# Patient Record
Sex: Female | Born: 2000
Health system: Southern US, Community
[De-identification: ages and names within clinical notes are randomized; demographics above are authoritative.]

## PROBLEM LIST (undated history)

## (undated) ENCOUNTER — Inpatient Hospital Stay (HOSPITAL_COMMUNITY): Payer: Self-pay

## (undated) DIAGNOSIS — Z6379 Other stressful life events affecting family and household: Secondary | ICD-10-CM

## (undated) DIAGNOSIS — A549 Gonococcal infection, unspecified: Secondary | ICD-10-CM

## (undated) DIAGNOSIS — E039 Hypothyroidism, unspecified: Secondary | ICD-10-CM

## (undated) DIAGNOSIS — J45909 Unspecified asthma, uncomplicated: Secondary | ICD-10-CM

## (undated) DIAGNOSIS — F99 Mental disorder, not otherwise specified: Secondary | ICD-10-CM

## (undated) DIAGNOSIS — L309 Dermatitis, unspecified: Secondary | ICD-10-CM

## (undated) DIAGNOSIS — E063 Autoimmune thyroiditis: Secondary | ICD-10-CM

## (undated) HISTORY — DX: Dermatitis, unspecified: L30.9

## (undated) HISTORY — DX: Other stressful life events affecting family and household: Z63.79

## (undated) HISTORY — DX: Mental disorder, not otherwise specified: F99

## (undated) HISTORY — DX: Gonococcal infection, unspecified: A54.9

## (undated) HISTORY — DX: Unspecified asthma, uncomplicated: J45.909

## (undated) HISTORY — PX: NO PAST SURGERIES: SHX2092

---
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
## 2019-01-20 ENCOUNTER — Telehealth: Payer: Self-pay | Admitting: *Deleted

## 2019-01-20 ENCOUNTER — Telehealth: Payer: Self-pay | Admitting: Women's Health

## 2019-01-20 ENCOUNTER — Other Ambulatory Visit: Payer: Self-pay | Admitting: Women's Health

## 2019-01-20 MED ORDER — NORETHINDRONE 0.35 MG PO TABS: 1.0000 | ORAL_TABLET | Freq: Every day | ORAL | 11 refills | Status: DC

## 2019-01-20 NOTE — Telephone Encounter (Signed)
Patient states she is wanting to switch to BCP as she is gaining weight since taking the Depo. Informed she would need to take at the same time daily and cannot miss doses.  She is currently still breastfeeding.  Denies history of migraines, blood clots, stroke.

## 2019-01-20 NOTE — Telephone Encounter (Signed)
Pt would like to see if can change from the depo shot to birth control pills. Please advise.

## 2019-03-10 ENCOUNTER — Telehealth: Payer: Self-pay | Admitting: *Deleted

## 2019-03-10 ENCOUNTER — Ambulatory Visit: Payer: Medicaid Other

## 2019-03-10 NOTE — Telephone Encounter (Signed)
Patient wants STD testing and pregnancy test.

## 2019-03-12 NOTE — Telephone Encounter (Signed)
Scheduled for 03/17/19 at 8:30am

## 2019-03-17 ENCOUNTER — Encounter: Payer: Self-pay | Admitting: Internal Medicine

## 2019-03-17 ENCOUNTER — Ambulatory Visit (INDEPENDENT_AMBULATORY_CARE_PROVIDER_SITE_OTHER): Payer: Medicaid Other | Admitting: Internal Medicine

## 2019-03-17 ENCOUNTER — Other Ambulatory Visit: Payer: Self-pay

## 2019-03-17 VITALS — BP 126/70 | HR 70 | Ht 64.0 in | Wt 219.0 lb

## 2019-03-17 DIAGNOSIS — Z3202 Encounter for pregnancy test, result negative: Secondary | ICD-10-CM | POA: Diagnosis not present

## 2019-03-17 DIAGNOSIS — Z113 Encounter for screening for infections with a predominantly sexual mode of transmission: Secondary | ICD-10-CM

## 2019-03-17 LAB — POCT URINE PREGNANCY: Preg Test, Ur: NEGATIVE
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
## 2019-03-17 NOTE — Patient Instructions (Signed)
Your pregnancy test was negative. We will call you if any of your screening comes back positive.   Take Care,  Dr. Juleen China

## 2019-03-20 ENCOUNTER — Other Ambulatory Visit: Payer: Self-pay | Admitting: Internal Medicine

## 2019-03-20 DIAGNOSIS — A549 Gonococcal infection, unspecified: Secondary | ICD-10-CM

## 2019-03-20 LAB — NUSWAB VAGINITIS PLUS (VG+)
Candida albicans, NAA: NEGATIVE
Candida glabrata, NAA: NEGATIVE
Chlamydia trachomatis, NAA: NEGATIVE
Neisseria gonorrhoeae, NAA: POSITIVE — AB
Trich vag by NAA: NEGATIVE

## 2019-03-20 MED ORDER — AZITHROMYCIN 500 MG PO TABS: 1000.0000 mg | ORAL_TABLET | Freq: Once | ORAL | 0 refills | Status: AC

## 2019-03-20 NOTE — Progress Notes (Signed)
Results showed patient Gonorrhea+. Have sent Azithromycin 1000 mg to pharmacy. Will route to Barbara Rieger, RN to inform patient and schedule RN visit to receive Rocephin.   Barbara Vazquez, D.O. [facility] Fellow, [facility] for [facility], [facility] 03/20/2019, 10:33 AM

## 2019-03-23 ENCOUNTER — Ambulatory Visit (INDEPENDENT_AMBULATORY_CARE_PROVIDER_SITE_OTHER): Payer: Medicaid Other | Admitting: *Deleted

## 2019-03-23 ENCOUNTER — Ambulatory Visit: Payer: Self-pay

## 2019-03-23 ENCOUNTER — Other Ambulatory Visit: Payer: Self-pay

## 2019-03-23 ENCOUNTER — Telehealth: Payer: Self-pay | Admitting: *Deleted

## 2019-03-23 ENCOUNTER — Encounter: Payer: Self-pay | Admitting: *Deleted

## 2019-03-23 DIAGNOSIS — A549 Gonococcal infection, unspecified: Secondary | ICD-10-CM | POA: Diagnosis not present

## 2019-03-23 MED ORDER — CEFTRIAXONE SODIUM 250 MG IJ SOLR
250.0000 mg | Freq: Once | INTRAMUSCULAR | Status: AC
  Administered 2019-03-23: 250 mg via INTRAMUSCULAR
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
## 2019-03-23 NOTE — Progress Notes (Signed)
Pt here for Rocephin 250 mg due to + gonorrhea. Pt received shot in right hip. Pt took oral med while in office. Pt to return in 4 weeks for POC. Pt voiced understanding. Steele

## 2019-03-31 ENCOUNTER — Ambulatory Visit (INDEPENDENT_AMBULATORY_CARE_PROVIDER_SITE_OTHER): Payer: Medicaid Other | Admitting: Pediatrics

## 2019-03-31 ENCOUNTER — Encounter: Payer: Self-pay | Admitting: Pediatrics

## 2019-03-31 ENCOUNTER — Other Ambulatory Visit: Payer: Self-pay

## 2019-03-31 VITALS — BP 132/86 | Ht 64.5 in | Wt 220.2 lb

## 2019-03-31 DIAGNOSIS — Z68.41 Body mass index (BMI) pediatric, greater than or equal to 95th percentile for age: Secondary | ICD-10-CM

## 2019-03-31 DIAGNOSIS — Z0001 Encounter for general adult medical examination with abnormal findings: Secondary | ICD-10-CM

## 2019-03-31 DIAGNOSIS — E669 Obesity, unspecified: Secondary | ICD-10-CM

## 2019-03-31 NOTE — Progress Notes (Signed)
Adolescent Well Care Visit Barbara Vazquez is a 18 y.o. female who is here for well care.    PCP:  Rosiland Oz, MD   History was provided by the patient.  Current Issues: Current concerns include needs form for a daycare completed, mother is seeking employment there.   She is a new mother as well, she has an infant girl and is breast feeding her daughter. The child's father is currently "not helping her"   Nutrition: Nutrition/Eating Behaviors: eats variety  Supplements/ Vitamins: yes   Sleep:  Sleep:  Night time awakenings with her daughter   Social Screening: Lives with:  Alone, has support from a friend over  Parental relations:  good Activities, Work, and Regulatory affairs officer?: yes Concerns regarding behavior with peers?  no Stressors of note: no  Menstruation:   No LMP recorded (lmp unknown). (Menstrual status: Lactating). Menstrual History: n/a   Confidential Social History: Tobacco?  no Secondhand smoke exposure?  no Drugs/ETOH?  no  Sexually Active?  yes   Pregnancy Prevention: condoms  Safe at home, in school & in relationships?  Yes Safe to self?  Yes   Screenings: Patient has a dental home: yes   PHQ-9 completed and results indicated 10  Physical Exam:  Vitals:   03/31/19 1450  BP: 132/86  Weight: 220 lb 3.2 oz (99.9 kg)  Height: 5' 4.5" (1.638 m)   BP 132/86   Ht 5' 4.5" (1.638 m)   Wt 220 lb 3.2 oz (99.9 kg)   LMP  (LMP Unknown)   BMI 37.21 kg/m  Body mass index: body mass index is 37.21 kg/m. Blood pressure percentiles are not available for patients who are 18 years or older.   Hearing Screening   125Hz  250Hz  500Hz  1000Hz  2000Hz  3000Hz  4000Hz  6000Hz  8000Hz   Right ear:           Left ear:             Visual Acuity Screening   Right eye Left eye Both eyes  Without correction: 20/20 20/25   With correction:       General Appearance:   alert, oriented, no acute distress  HENT: Normocephalic, no obvious abnormality, conjunctiva clear   Mouth:   Normal appearing teeth, no obvious discoloration, dental caries, or dental caps  Neck:   Supple; thyroid: no enlargement, symmetric, no tenderness/mass/nodules  Chest Normal   Lungs:   Clear to auscultation bilaterally, normal work of breathing  Heart:   Regular rate and rhythm, S1 and S2 normal, no murmurs;   Abdomen:   Soft, non-tender, no mass, or organomegaly  GU genitalia not examined  Musculoskeletal:   Tone and strength strong and symmetrical, all extremities               Lymphatic:   No cervical adenopathy  Skin/Hair/Nails:   Skin warm, dry and intact, no rashes, no bruises or petechiae  Neurologic:   Strength, gait, and coordination normal and age-appropriate     Assessment and Plan:   .1. Encounter for general adult medical examination with abnormal findings - GC/Chlamydia Probe Amp([facility])  2. Obesity peds (BMI >=95 percentile)   BMI is not appropriate for age  Hearing screening result:unable to screen, screener not working Vision screening result: normal  Counseling provided for all of the vaccine components  Orders Placed This Encounter  Procedures  . GC/Chlamydia Probe Amp([facility])    MD completed daycare form and gave to patient today   MD gave [facility] vaccination record and  advised her to receive her Hep B, Hep A, Flu, Varicella and Men B at the local [facility]   Return in 1 year (on 03/30/2020).Fransisca Connors, MD

## 2019-04-02 LAB — GC/CHLAMYDIA PROBE AMP
Chlamydia trachomatis, NAA: NEGATIVE
Neisseria Gonorrhoeae by PCR: NEGATIVE

## 2019-04-20 ENCOUNTER — Ambulatory Visit: Payer: Medicaid Other | Admitting: Women's Health

## 2019-05-11 ENCOUNTER — Other Ambulatory Visit: Payer: Self-pay

## 2019-05-11 ENCOUNTER — Ambulatory Visit: Payer: Medicaid Other | Admitting: Women's Health

## 2019-05-26 ENCOUNTER — Other Ambulatory Visit: Payer: Self-pay

## 2019-05-26 DIAGNOSIS — Z20828 Contact with and (suspected) exposure to other viral communicable diseases: Secondary | ICD-10-CM | POA: Diagnosis not present

## 2019-05-26 DIAGNOSIS — Z20822 Contact with and (suspected) exposure to covid-19: Secondary | ICD-10-CM

## 2019-05-27 LAB — NOVEL CORONAVIRUS, NAA: SARS-CoV-2, NAA: NOT DETECTED

## 2019-06-19 IMAGING — US US ABDOMEN LIMITED
1 series · 14 of 25 positions shown · non-contrast
Comparison: None.

CLINICAL DATA: Right upper quadrant pain

EXAM:
ULTRASOUND ABDOMEN LIMITED RIGHT UPPER QUADRANT

[Series 1: us abdomen limited · 0.19mm/px · 14 of 56 slices shown]
[im 1/56]
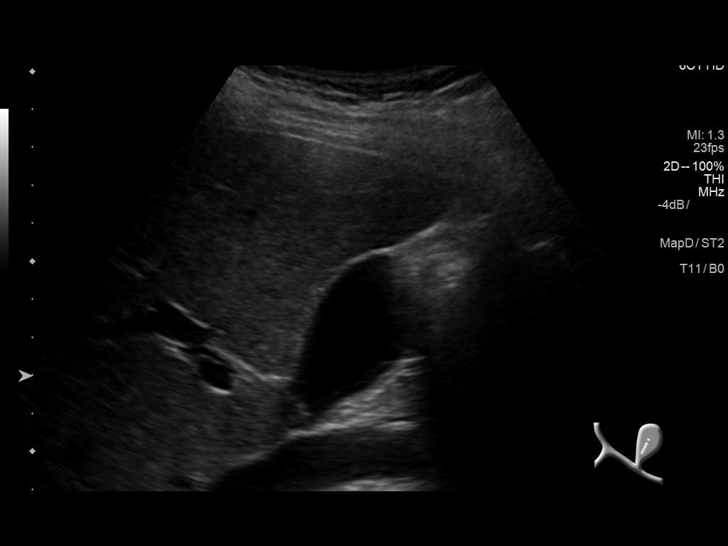
[im 5/56]
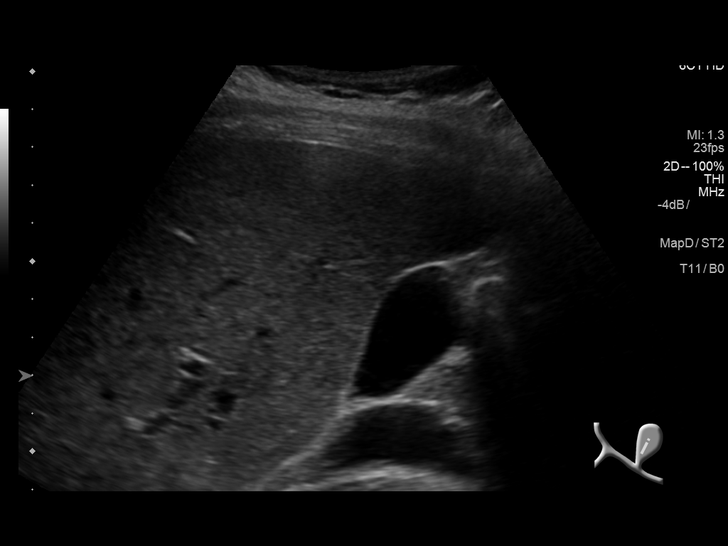
[im 10/56]
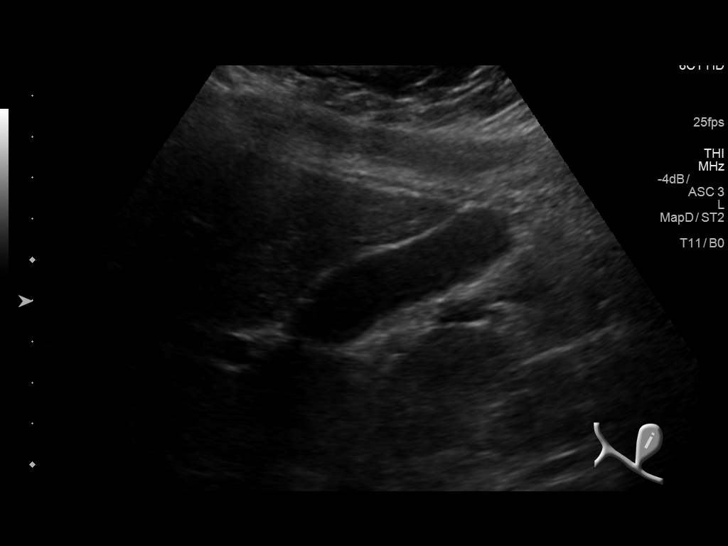
[im 14/56]
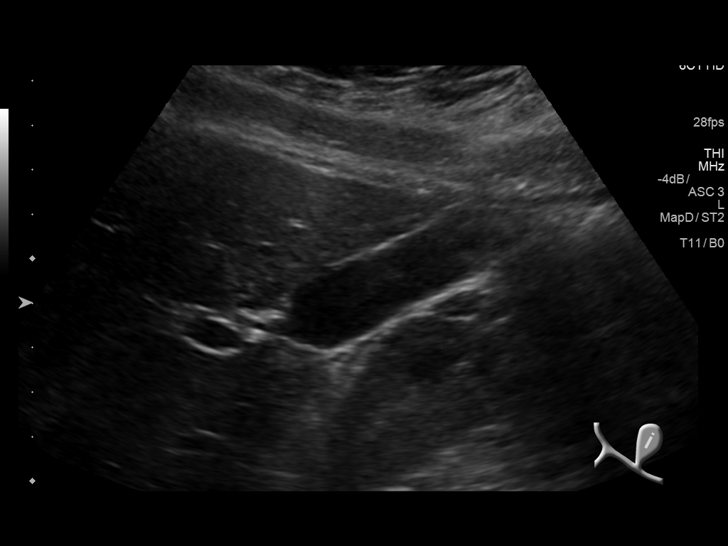
[im 19/56]
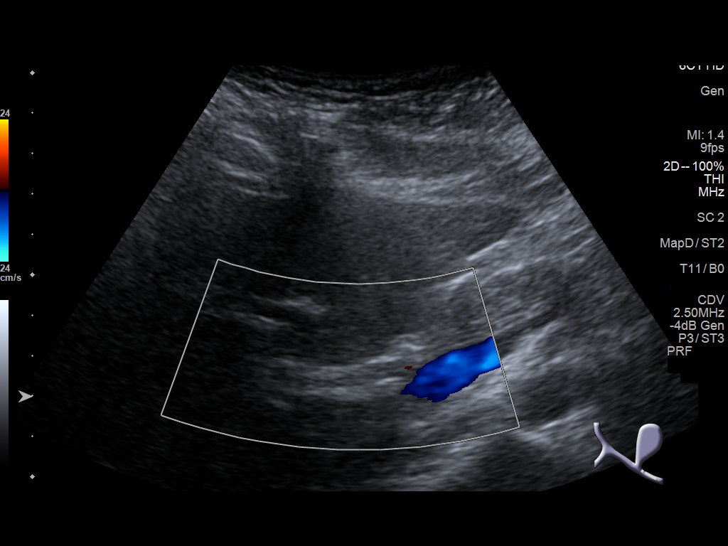
[im 21/56]
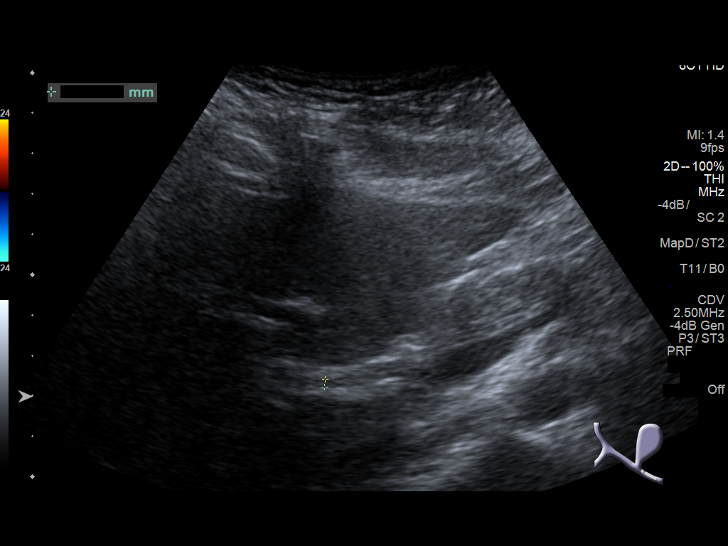
[im 26/56]
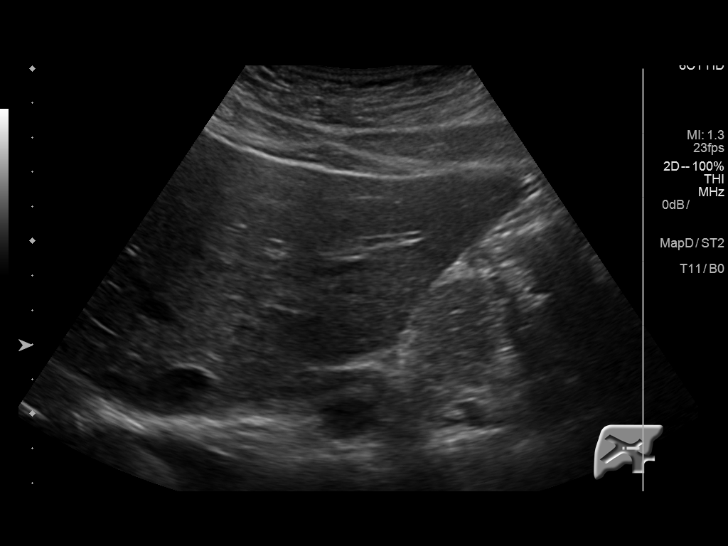
[im 30/56]
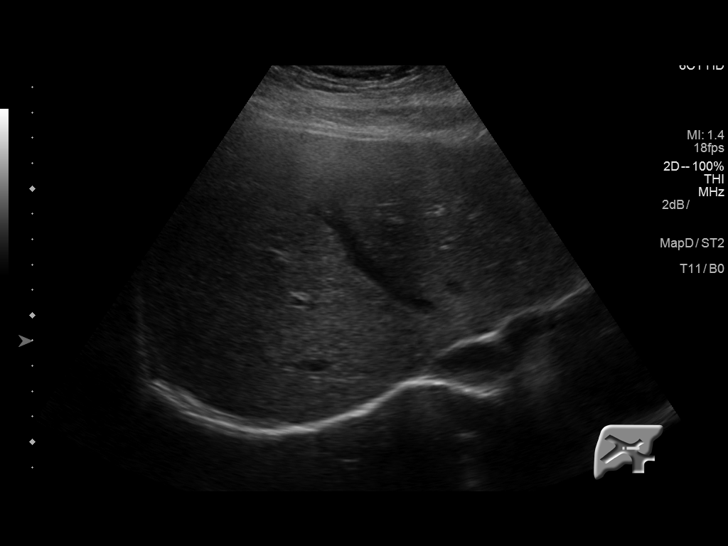
[im 35/56]
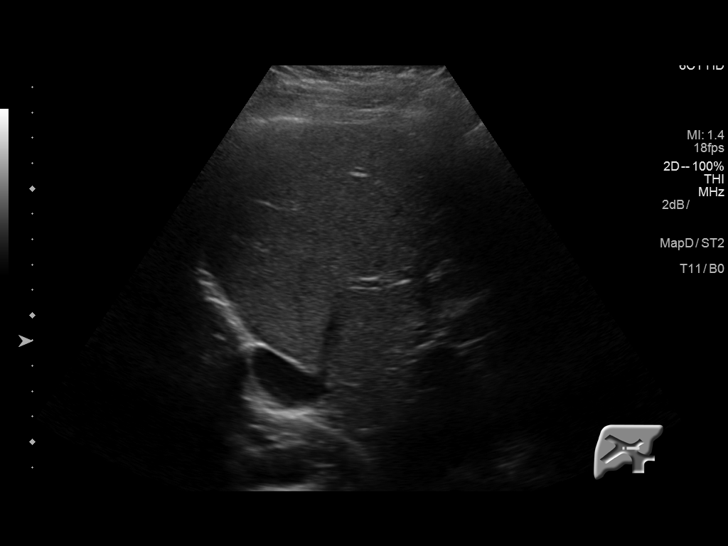
[im 37/56]
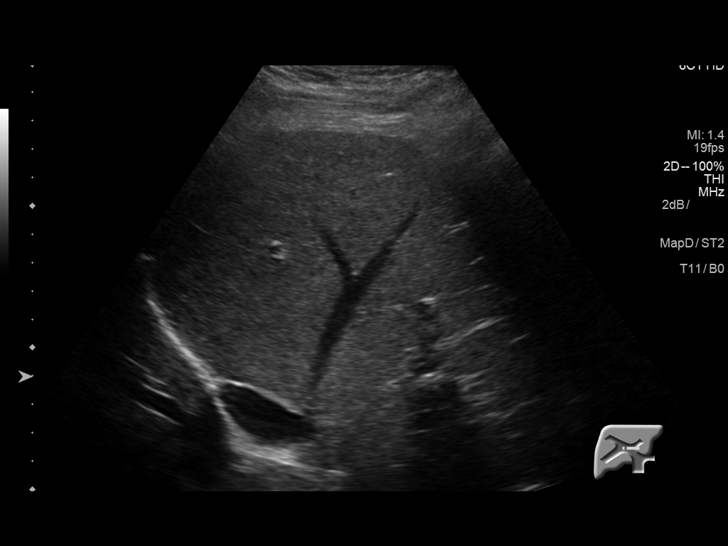
[im 42/56]
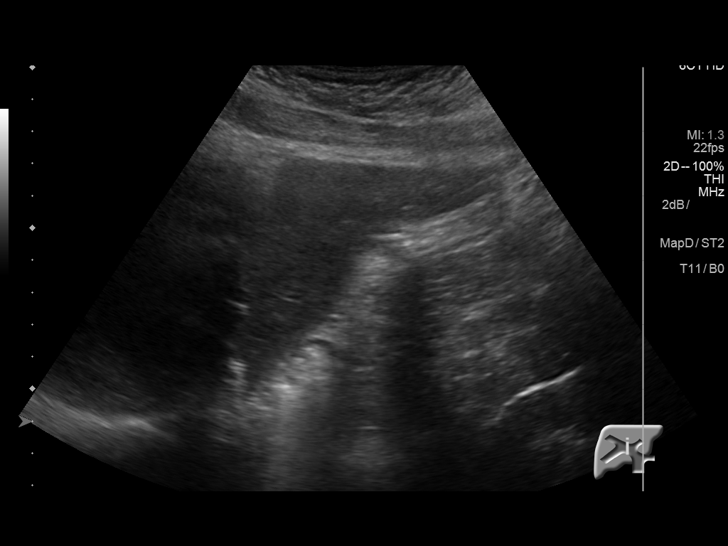
[im 46/56]
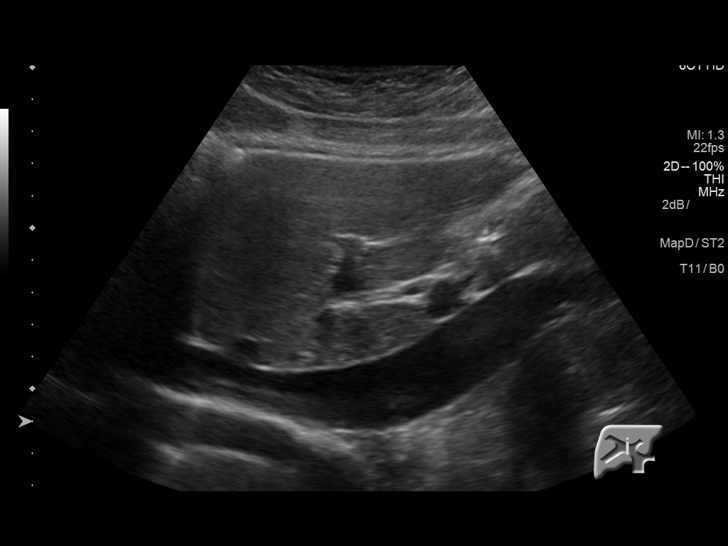
[im 51/56]
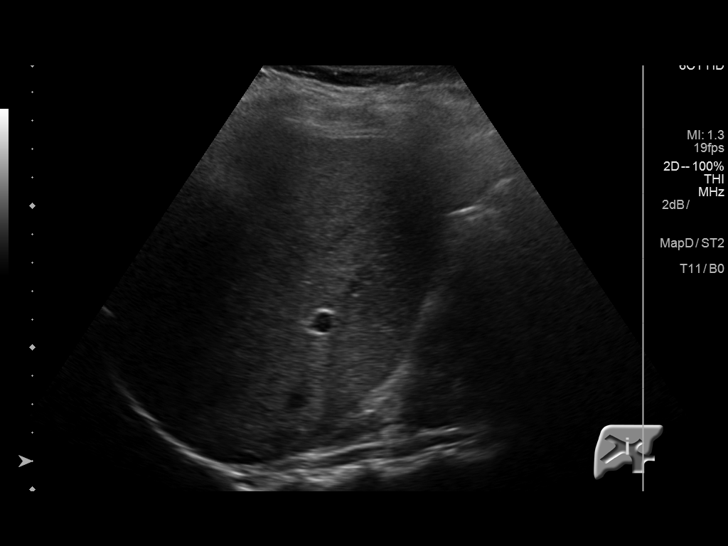
[im 56/56]
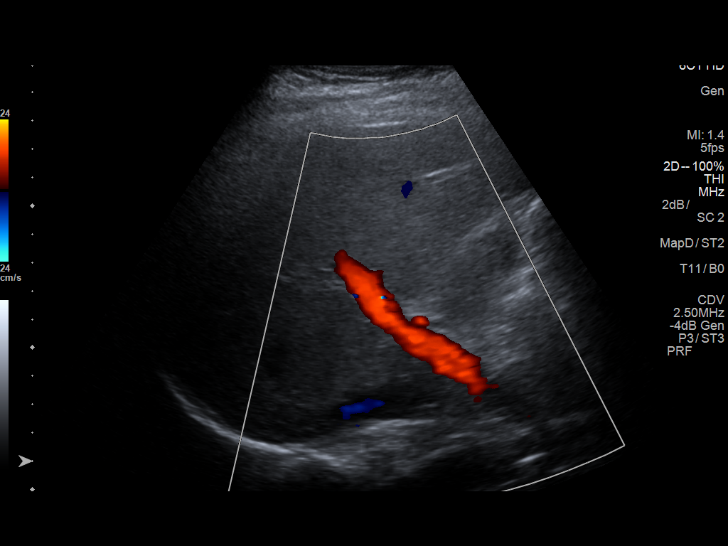

[14 of 25 positions shown; findings below may reference images not displayed]

FINDINGS: Gallbladder:

No gallstones or wall thickening visualized. No sonographic Murphy
sign noted by sonographer.

Common bile duct:

Diameter: Normal caliber, 4 mm

Liver:

No focal lesion identified. Within normal limits in parenchymal
echogenicity. Portal vein is patent on color Doppler imaging with
normal direction of blood flow towards the liver.
IMPRESSION: Normal right upper quadrant ultrasound.

## 2019-09-16 ENCOUNTER — Other Ambulatory Visit: Payer: Self-pay

## 2019-09-16 ENCOUNTER — Ambulatory Visit (INDEPENDENT_AMBULATORY_CARE_PROVIDER_SITE_OTHER): Payer: Medicaid Other | Admitting: Pediatrics

## 2019-09-16 VITALS — Wt 226.2 lb

## 2019-09-16 DIAGNOSIS — L309 Dermatitis, unspecified: Secondary | ICD-10-CM | POA: Diagnosis not present

## 2019-09-16 MED ORDER — HYDROCORTISONE 2.5 % EX CREA: TOPICAL_CREAM | Freq: Two times a day (BID) | CUTANEOUS | 2 refills | Status: DC

## 2019-09-16 NOTE — Progress Notes (Signed)
This is Lorelle Formosa, a 19 year old patient who was brought in by herself with the chief complaint of eczema.  This child was well until 7 days ago when they had symptoms of itching  followed by redness of the area..    Onset & character of symptoms    Location upper extremities.   Radiation none   Quality itches   Quantity 6-8/10   Duration several hours   Frequency nightly after showering    Aggravating Factors heat/shower   Reliving Factors lotion   Associated signs and symptoms none  Alterations in behavior, activity, eating, sleeping, elimination, affects sleep   Significant Past Medical History related to Chief Complaint:    Illnesses: Recent, when, where, severity, treatment, follow-up N/A   Current medications include OTC lotions   Exposure(day care, school, travel) none     Home treatments/medications tried: lotion    Review of systems is unremarkable except for red, bumpy rash on upper extrimities   On presentation to the clinic today, child's physical exam includes:   HEENT - unremarkable   CV- RRR with out murmur   Resp- CTA   GI- Abd soft with good bowel sounds   GU- not examined   MS- AROM   Neuro - no deficients    Labs- n/a   This is a 19year old female with eczema.   Plan - use hydrocortisone with lotion up to 2 times daily for not more then 7 days at a time Prescription-2.5% hydrocortisone Follow-up - PRN  Please call or return to clinic if symptoms fail to improve or worsen, especially symptoms of itching.

## 2019-09-16 NOTE — Patient Instructions (Signed)

## 2019-10-21 ENCOUNTER — Ambulatory Visit (INDEPENDENT_AMBULATORY_CARE_PROVIDER_SITE_OTHER): Payer: Medicaid Other | Admitting: Pediatrics

## 2019-10-21 ENCOUNTER — Other Ambulatory Visit: Payer: Self-pay

## 2019-10-21 DIAGNOSIS — Z111 Encounter for screening for respiratory tuberculosis: Secondary | ICD-10-CM

## 2019-10-21 NOTE — Progress Notes (Signed)
This patient is here to have a TB skin test placed.  NP placed TB skin test in the right forearm.  This patient will return in 48 hours to have the TB skin test read.

## 2019-10-23 ENCOUNTER — Ambulatory Visit (INDEPENDENT_AMBULATORY_CARE_PROVIDER_SITE_OTHER): Payer: Self-pay | Admitting: Pediatrics

## 2019-10-23 ENCOUNTER — Other Ambulatory Visit: Payer: Self-pay

## 2019-10-23 DIAGNOSIS — Z111 Encounter for screening for respiratory tuberculosis: Secondary | ICD-10-CM

## 2019-10-26 ENCOUNTER — Ambulatory Visit: Payer: Self-pay

## 2019-11-16 IMAGING — US US OB < 14 WEEKS - US OB TV
1 series · 14 of 28 positions shown · non-contrast
Comparison: None.

CLINICAL DATA: Dating of pregnancy.

EXAM:
OBSTETRIC <14 WK US AND TRANSVAGINAL OB US
TECHNIQUE: Both transabdominal and transvaginal ultrasound examinations were
performed for complete evaluation of the gestation as well as the
maternal uterus, adnexal regions, and pelvic cul-de-sac.
Transvaginal technique was performed to assess early pregnancy.

[Series 1: us ob < 14 weeks - us ob tv · 0.19mm/px · 14 of 64 slices shown]
[im 3/64]
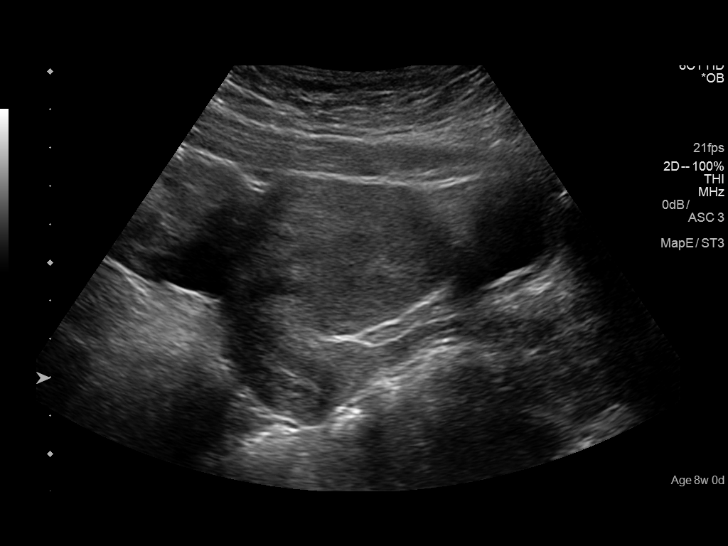
[im 8/64]
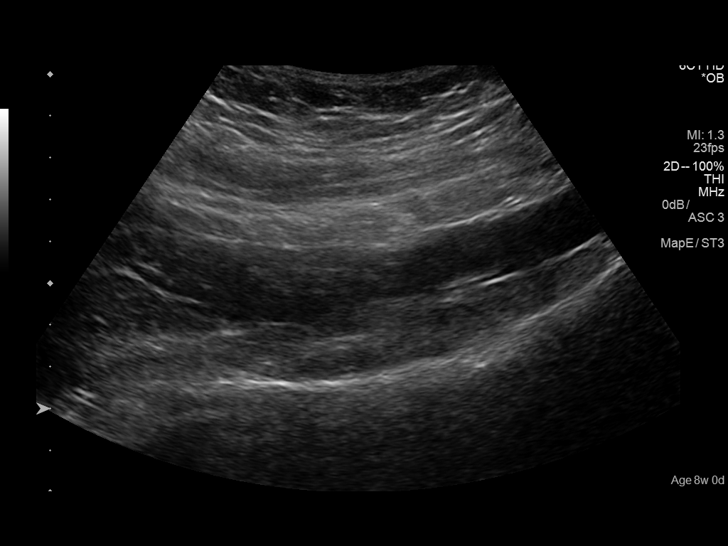
[im 12/64]
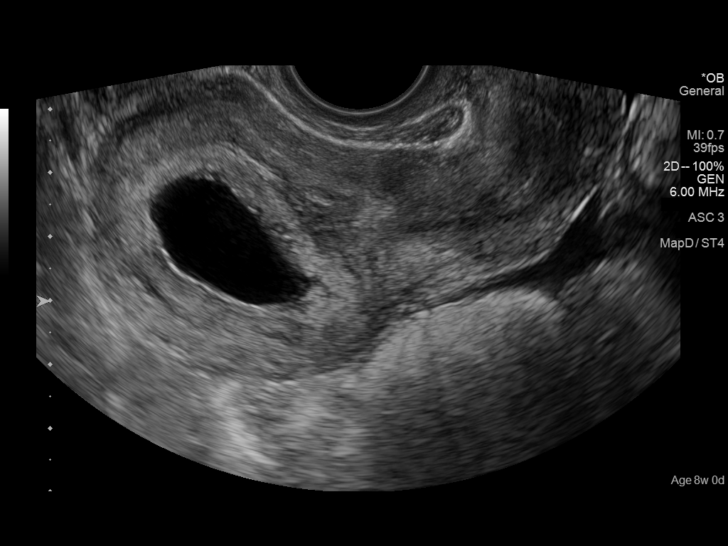
[im 17/64]
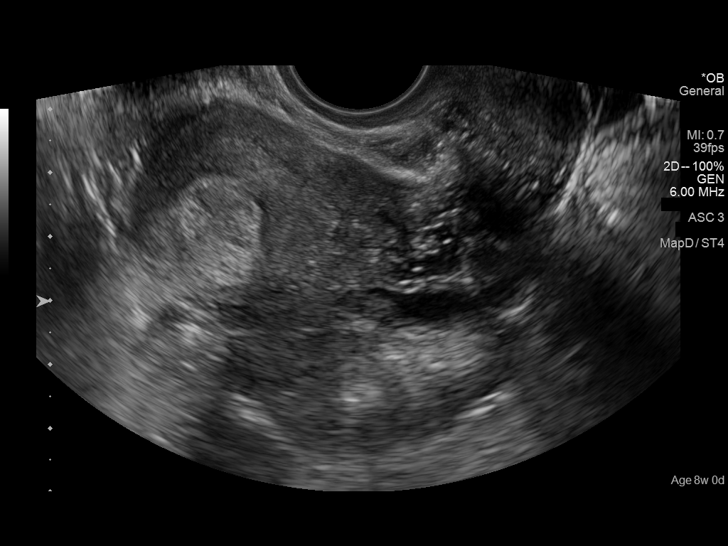
[im 22/64]
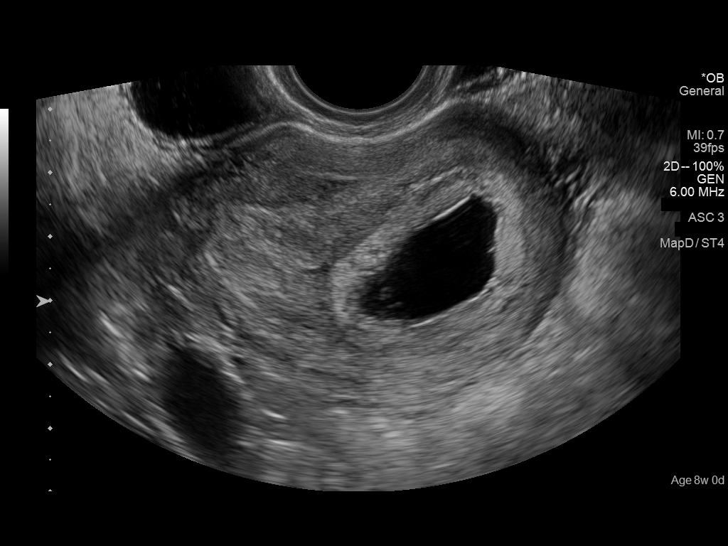
[im 26/64]
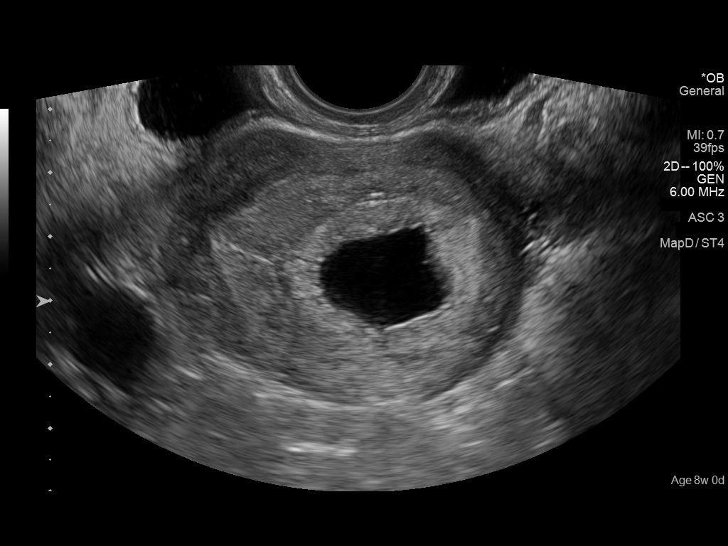
[im 31/64]
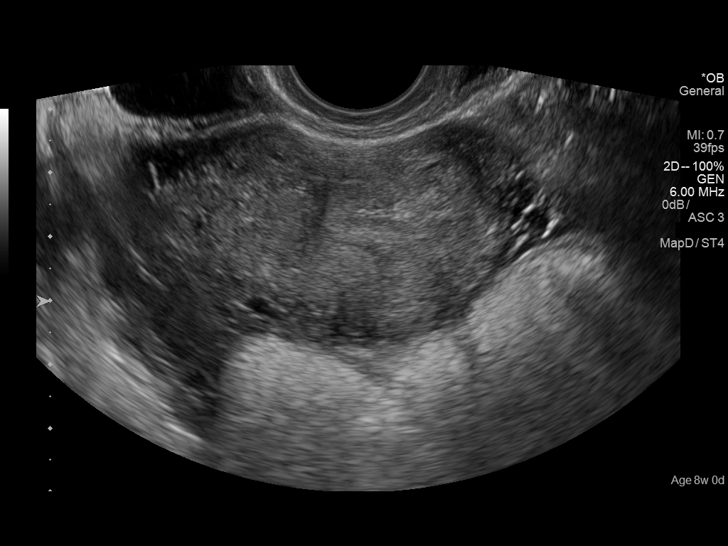
[im 36/64]
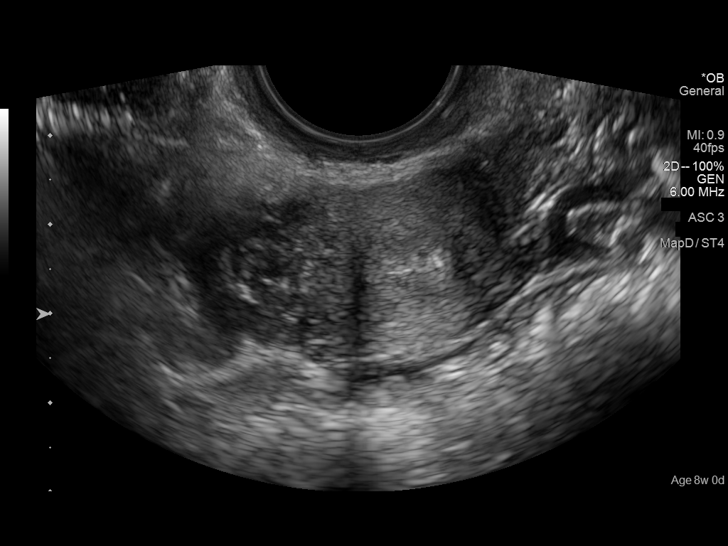
[im 40/64]
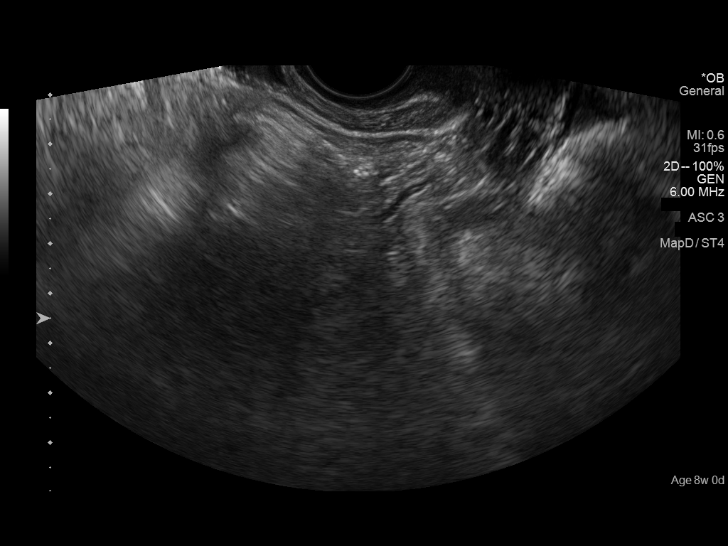
[im 45/64]
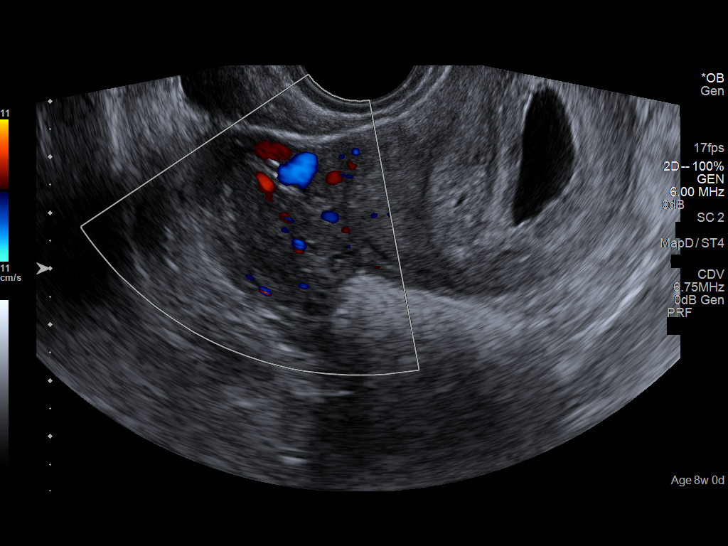
[im 50/64]
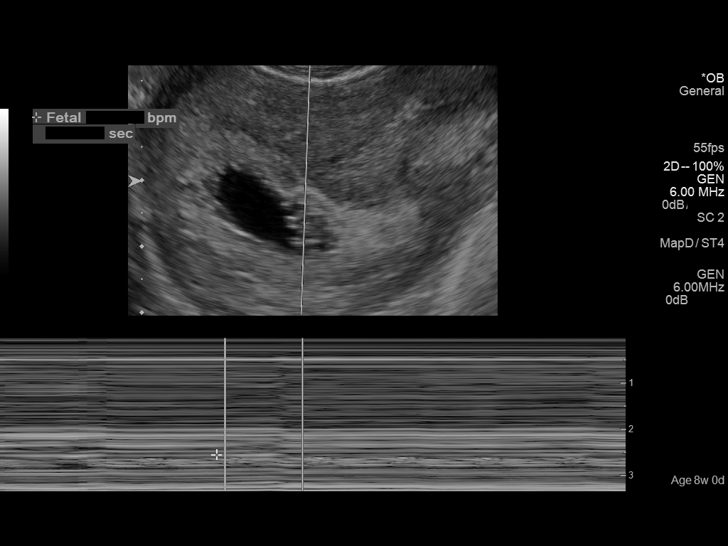
[im 54/64]
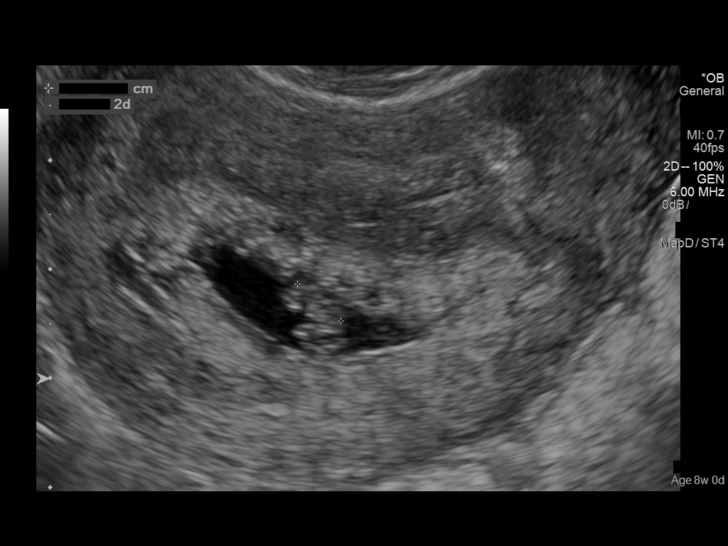
[im 59/64]
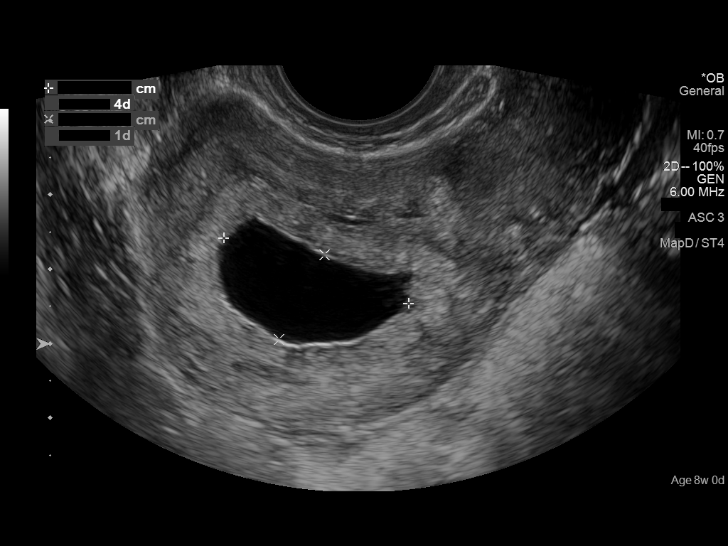
[im 64/64]
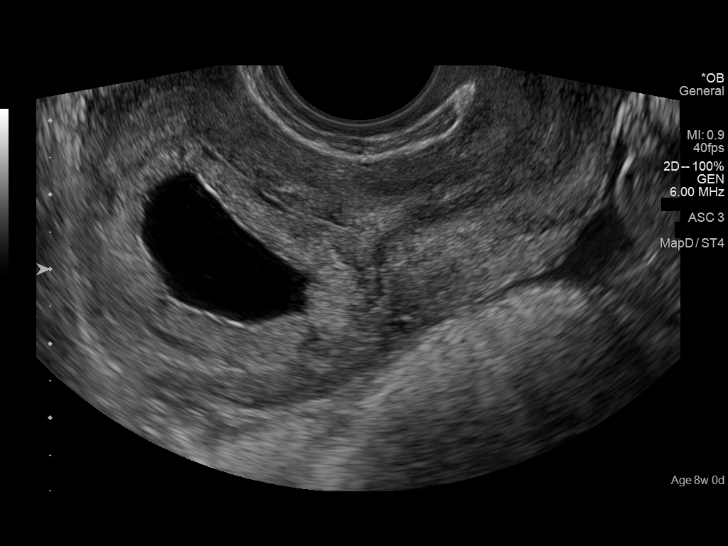

[14 of 28 positions shown; findings below may reference images not displayed]

FINDINGS: Intrauterine gestational sac: Single

Yolk sac:  Yes

Embryo:  Yes

Cardiac Activity: Yes

Heart Rate: 132 bpm

CRL:  5.4 mm   6 w   2 d                  US EDC: 09/27/2018

Subchorionic hemorrhage:  None visualized.

Maternal uterus/adnexae:

Right ovary: Normal

Left ovary: Not visualized due to overlying bowel gas.

Other :None

Free fluid:  None
IMPRESSION: 1. Single living intrauterine gestation with an estimated
gestational age of 6 weeks and 2 days.

## 2019-12-11 ENCOUNTER — Ambulatory Visit: Payer: Medicaid Other

## 2019-12-31 DIAGNOSIS — Z419 Encounter for procedure for purposes other than remedying health state, unspecified: Secondary | ICD-10-CM | POA: Diagnosis not present

## 2020-01-31 DIAGNOSIS — Z419 Encounter for procedure for purposes other than remedying health state, unspecified: Secondary | ICD-10-CM | POA: Diagnosis not present

## 2020-03-02 DIAGNOSIS — Z419 Encounter for procedure for purposes other than remedying health state, unspecified: Secondary | ICD-10-CM | POA: Diagnosis not present

## 2020-03-04 ENCOUNTER — Other Ambulatory Visit: Payer: Medicaid Other

## 2020-03-04 ENCOUNTER — Other Ambulatory Visit: Payer: Self-pay

## 2020-03-04 DIAGNOSIS — Z20822 Contact with and (suspected) exposure to covid-19: Secondary | ICD-10-CM | POA: Diagnosis not present

## 2020-03-06 LAB — NOVEL CORONAVIRUS, NAA: SARS-CoV-2, NAA: DETECTED — AB

## 2020-03-07 ENCOUNTER — Encounter: Payer: Self-pay | Admitting: Oncology

## 2020-03-07 ENCOUNTER — Telehealth (HOSPITAL_COMMUNITY): Payer: Self-pay | Admitting: Oncology

## 2020-03-07 NOTE — Telephone Encounter (Signed)
Called to Discuss with patient about Covid symptoms and the use of regeneron, a monoclonal antibody infusion for those with mild to moderate Covid symptoms and at a high risk of hospitalization.     Pt is qualified for this infusion at the [facility]  infusion center due to co-morbid conditions and/or a member of an at-risk group.     Patient is feeling much better and does not feel like she will need mab. Will call back if anything changes.   Mignon Pine, AGNP-C [phone] (Infusion Center Hotline)

## 2020-03-17 ENCOUNTER — Other Ambulatory Visit: Payer: Self-pay

## 2020-04-01 DIAGNOSIS — Z419 Encounter for procedure for purposes other than remedying health state, unspecified: Secondary | ICD-10-CM | POA: Diagnosis not present

## 2020-05-02 DIAGNOSIS — Z419 Encounter for procedure for purposes other than remedying health state, unspecified: Secondary | ICD-10-CM | POA: Diagnosis not present

## 2020-05-03 ENCOUNTER — Other Ambulatory Visit: Payer: Self-pay

## 2020-05-03 ENCOUNTER — Emergency Department (HOSPITAL_COMMUNITY): Payer: Medicaid Other

## 2020-05-03 ENCOUNTER — Ambulatory Visit
Admission: EM | Admit: 2020-05-03 | Discharge: 2020-05-03 | Disposition: A | Discharge status: Home / Self Care | Payer: Medicaid Other | Attending: Emergency Medicine | Admitting: Emergency Medicine

## 2020-05-03 ENCOUNTER — Encounter (HOSPITAL_COMMUNITY): Payer: Self-pay

## 2020-05-03 ENCOUNTER — Emergency Department (HOSPITAL_COMMUNITY)
Admission: EM | Admit: 2020-05-03 | Discharge: 2020-05-03 | Disposition: A | Discharge status: Home / Self Care | Payer: Medicaid Other | Attending: Emergency Medicine | Admitting: Emergency Medicine

## 2020-05-03 DIAGNOSIS — R102 Pelvic and perineal pain: Secondary | ICD-10-CM

## 2020-05-03 DIAGNOSIS — M545 Low back pain, unspecified: Secondary | ICD-10-CM | POA: Diagnosis not present

## 2020-05-03 DIAGNOSIS — R300 Dysuria: Secondary | ICD-10-CM | POA: Diagnosis present

## 2020-05-03 DIAGNOSIS — R109 Unspecified abdominal pain: Secondary | ICD-10-CM | POA: Insufficient documentation

## 2020-05-03 DIAGNOSIS — Z87891 Personal history of nicotine dependence: Secondary | ICD-10-CM | POA: Diagnosis not present

## 2020-05-03 DIAGNOSIS — N3001 Acute cystitis with hematuria: Secondary | ICD-10-CM | POA: Insufficient documentation

## 2020-05-03 DIAGNOSIS — N390 Urinary tract infection, site not specified: Secondary | ICD-10-CM

## 2020-05-03 DIAGNOSIS — J45909 Unspecified asthma, uncomplicated: Secondary | ICD-10-CM | POA: Diagnosis not present

## 2020-05-03 LAB — POCT URINALYSIS DIP (MANUAL ENTRY)
Bilirubin, UA: NEGATIVE
Glucose, UA: NEGATIVE mg/dL
Ketones, POC UA: NEGATIVE mg/dL
Nitrite, UA: POSITIVE — AB
Protein Ur, POC: 100 mg/dL — AB
Spec Grav, UA: 1.025 (ref 1.010–1.025)
Urobilinogen, UA: 0.2 E.U./dL
pH, UA: 7 (ref 5.0–8.0)

## 2020-05-03 LAB — CBC
HCT: 43.1 % (ref 36.0–46.0)
Hemoglobin: 13.5 g/dL (ref 12.0–15.0)
MCH: 29.1 pg (ref 26.0–34.0)
MCHC: 31.3 g/dL (ref 30.0–36.0)
MCV: 92.9 fL (ref 80.0–100.0)
Platelets: 460 10*3/uL — ABNORMAL HIGH (ref 150–400)
RBC: 4.64 MIL/uL (ref 3.87–5.11)
RDW: 13.9 % (ref 11.5–15.5)
WBC: 11.2 10*3/uL — ABNORMAL HIGH (ref 4.0–10.5)
nRBC: 0 % (ref 0.0–0.2)

## 2020-05-03 LAB — COMPREHENSIVE METABOLIC PANEL
ALT: 14 U/L (ref 0–44)
AST: 12 U/L — ABNORMAL LOW (ref 15–41)
Albumin: 4.1 g/dL (ref 3.5–5.0)
Alkaline Phosphatase: 63 U/L (ref 38–126)
Anion gap: 7 (ref 5–15)
BUN: 11 mg/dL (ref 6–20)
CO2: 27 mmol/L (ref 22–32)
Calcium: 8.9 mg/dL (ref 8.9–10.3)
Chloride: 101 mmol/L (ref 98–111)
Creatinine, Ser: 0.62 mg/dL (ref 0.44–1.00)
GFR, Estimated: >60 mL/min (ref >60)
Glucose, Bld: 89 mg/dL (ref 70–99)
Potassium: 3.9 mmol/L (ref 3.5–5.1)
Sodium: 135 mmol/L (ref 135–145)
Total Bilirubin: 0.3 mg/dL (ref 0.3–1.2)
Total Protein: 7.7 g/dL (ref 6.5–8.1)

## 2020-05-03 LAB — LIPASE, BLOOD: Lipase: 23 U/L (ref 11–51)

## 2020-05-03 LAB — WET PREP, GENITAL
Clue Cells Wet Prep HPF POC: NONE SEEN
Sperm: NONE SEEN
Trich, Wet Prep: NONE SEEN
Yeast Wet Prep HPF POC: NONE SEEN

## 2020-05-03 LAB — HIV ANTIBODY (ROUTINE TESTING W REFLEX): HIV Screen 4th Generation wRfx: NONREACTIVE

## 2020-05-03 LAB — PREGNANCY, URINE: Preg Test, Ur: NEGATIVE

## 2020-05-03 MED ORDER — AZITHROMYCIN 250 MG PO TABS
2000.0000 mg | ORAL_TABLET | Freq: Once | ORAL | Status: AC
  Administered 2020-05-03: 2000 mg via ORAL
  Filled 2020-05-03: qty 8

## 2020-05-03 MED ORDER — ACETAMINOPHEN 325 MG PO TABS
650.0000 mg | ORAL_TABLET | Freq: Once | ORAL | Status: AC
  Administered 2020-05-03: 650 mg via ORAL
  Filled 2020-05-03: qty 2

## 2020-05-03 MED ORDER — GENTAMICIN SULFATE 40 MG/ML IJ SOLN
240.0000 mg | Freq: Once | INTRAMUSCULAR | Status: AC
  Administered 2020-05-03: 240 mg via INTRAMUSCULAR
  Filled 2020-05-03: qty 6

## 2020-05-03 MED ORDER — NITROFURANTOIN MONOHYD MACRO 100 MG PO CAPS: 100.0000 mg | ORAL_CAPSULE | Freq: Two times a day (BID) | ORAL | 0 refills | Status: DC

## 2020-05-03 MED ORDER — DOXYCYCLINE HYCLATE 100 MG PO CAPS: 100.0000 mg | ORAL_CAPSULE | Freq: Two times a day (BID) | ORAL | 0 refills | Status: DC

## 2020-05-03 MED ORDER — ONDANSETRON HCL 4 MG PO TABS: 4.0000 mg | ORAL_TABLET | Freq: Three times a day (TID) | ORAL | 0 refills | Status: DC | PRN

## 2020-05-03 MED ORDER — ONDANSETRON 4 MG PO TBDP
4.0000 mg | ORAL_TABLET | Freq: Once | ORAL | Status: AC
  Administered 2020-05-03: 4 mg via ORAL
  Filled 2020-05-03: qty 1

## 2020-05-03 MED ORDER — OXYCODONE-ACETAMINOPHEN 5-325 MG PO TABS
1.0000 | ORAL_TABLET | Freq: Once | ORAL | Status: AC
  Administered 2020-05-03: 1 via ORAL
  Filled 2020-05-03: qty 1

## 2020-05-03 NOTE — ED Provider Notes (Signed)
[facility] EMERGENCY DEPARTMENT Provider Note   CSN: [id number] Arrival date & time: 05/03/20  1304     History Chief Complaint  Patient presents with  . Abdominal Pain    Barbara Vazquez is a 19 y.o. female.  HPI   Patient with no significant medical history presents to the emergency department with chief complaint of dysuria and vaginal pain.  Patient states this started on Saturday where she noticed pain with urination, denies hematuria, frequency, or hesitancy.  She then noticed that she had some vaginal discharge and pelvic cramping.  She states pelvic cramping is mostly felt in her lower back and states it feels like contractions.  She has been taking over-the-counter pain medication without any relief.  She states this feels different from her normal UTIs as she doesn't  have vaginal pain.  She denies recent sexual intercourse and her last menstrual cycle ended on the 26th without any abnormalities.  She was seen at urgent care today where they performed a UA showing possible UTI and started on antibiotics.  Patient has no history of kidney stones, abdominal surgeries, no abnormal Pap smears.  Patient denies headache, fever, chills, shortness of breath, chest pain, abdominal pain, vomiting, diarrhea, pedal edema.  Past Medical History:  Diagnosis Date  . Asthma   . Gonorrhea   . Mental disorder    anxiety  . Teenage parent    March 2020    Patient Active Problem List   Diagnosis Date Noted  . Gonorrhea 03/20/2019  . Postpartum care following vaginal delivery 10/27/2018  . Contraception management 10/27/2018  . 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 Asthma, chronic 01/06/2015  . Allergic conjunctivitis 10/13/2014  . 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 Seasonal allergies 10/28/2012    Past Surgical History:  Procedure Laterality Date  . NO PAST SURGERIES       OB History    Gravida  1   Para  1   Term  1   Preterm  0   AB  0   Living  1       SAB  0   TAB  0   Ectopic  0   Multiple  0   Live Births  1           Family History  Problem Relation Age of Onset  . Lung disease Paternal Grandfather   . Cancer Paternal Grandmother        skin  . Lung disease Maternal Grandmother   . Lung disease Maternal Grandfather   . Lung disease Father   . Mental illness Father   . Asthma Mother   . Mental illness Mother   . ADD / ADHD Brother     Social History   Tobacco Use  . Smoking status: Former Smoker    Types: E-cigarettes  . Smokeless tobacco: Never Used  . Tobacco comment: once a week  Vaping Use  . Vaping Use: Former  Substance Use Topics  . Alcohol use: No  . Drug use: No    Home Medications Prior to Admission medications   Medication Sig Start Date End Date Taking? Authorizing Provider  nitrofurantoin, macrocrystal-monohydrate, (MACROBID) 100 MG capsule Take 1 capsule (100 mg total) by mouth 2 (two) times daily. 05/03/20  Yes Avegno, Zachery Dakins, FNP  phenazopyridine (PYRIDIUM) 95 MG tablet Take 95 mg by mouth 3 (three) times daily as needed for pain.   Yes [provider]  doxycycline (VIBRAMYCIN) 100 MG capsule Take  1 capsule (100 mg total) by mouth 2 (two) times daily for 10 days. 05/03/20 05/13/20  Carroll Sage, PA-C  hydrocortisone 2.5 % cream Apply topically 2 (two) times daily. Patient not taking: Reported on 05/03/2020 09/16/19   Fredia Sorrow, NP  ondansetron (ZOFRAN) 4 MG tablet Take 1 tablet (4 mg total) by mouth every 8 (eight) hours as needed for nausea or vomiting. 05/03/20   Carroll Sage, PA-C    Allergies    Penicillins and Amoxicillin  Review of Systems   Review of Systems  Constitutional: Negative for chills and fever.  HENT: Negative for congestion, tinnitus, trouble swallowing and voice change.   Eyes: Negative for visual disturbance.  Respiratory: Negative for shortness of breath.   Cardiovascular: Negative for chest pain.  Gastrointestinal: Positive  for vomiting. Negative for abdominal pain, diarrhea and nausea.  Genitourinary: Positive for dysuria, pelvic pain and vaginal discharge. Negative for enuresis, flank pain, frequency, hematuria and vaginal bleeding.  Musculoskeletal: Negative for back pain.  Skin: Negative for rash.  Neurological: Negative for dizziness and headaches.  Hematological: Does not bruise/bleed easily.    Physical Exam Updated Vital Signs BP 122/62   Pulse 86   Temp 98.3 F (36.8 C) (Oral)   Resp 18   Ht 5\' 4"  (1.626 m)   Wt 102.1 kg   LMP 04/26/2020   SpO2 100%   BMI 38.62 kg/m   Physical Exam Vitals and nursing note reviewed. Exam conducted with a chaperone present.  Constitutional:      General: She is not in acute distress.    Appearance: Normal appearance. She is not ill-appearing.  HENT:     Head: Normocephalic and atraumatic.     Nose: Rhinorrhea present. No congestion.     Mouth/Throat:     Mouth: Mucous membranes are moist.     Pharynx: Oropharynx is clear.  Eyes:     General: No scleral icterus.       Right eye: No discharge.        Left eye: No discharge.  Cardiovascular:     Rate and Rhythm: Normal rate and regular rhythm.     Pulses: Normal pulses.     Heart sounds: No murmur heard.  No friction rub. No gallop.   Pulmonary:     Effort: No respiratory distress.     Breath sounds: No stridor. No wheezing, rhonchi or rales.  Abdominal:     General: There is no distension.     Palpations: Abdomen is soft.     Tenderness: There is no abdominal tenderness. There is no right CVA tenderness, left CVA tenderness or guarding.     Comments: Patient abdomen was visualized nondistended, normoactive bowel sounds, dull to percussion.  She had slight tenderness to palpation bilaterally in her pelvic region.  No rebound tenderness, no McBurney point noted.  Genitourinary:    General: Normal vulva.     Vagina: No vaginal discharge.     Comments: Pelvic exam was performed and exterior  genitalia was examined there was no lesions, rashes or discharge noted.  Vaginal canal was patent, pink, no lesions or trauma noted.  Cervix was visualized there was no lesions, discharge, or other abnormalities noted.  Patient had left adnexal pain worse than the right. Negative chandelier sign. Musculoskeletal:        General: No swelling or tenderness.     Right lower leg: No edema.     Left lower leg: No edema.  Skin:  General: Skin is warm and dry.     Findings: No rash.  Neurological:     Mental Status: She is alert and oriented to person, place, and time.  Psychiatric:        Mood and Affect: Mood normal.     ED Results / Procedures / Treatments   Labs (all labs ordered are listed, but only abnormal results are displayed) Labs Reviewed  WET PREP, GENITAL - Abnormal; Notable for the following components:      Result Value   WBC, Wet Prep HPF POC FEW (*)    All other components within normal limits  COMPREHENSIVE METABOLIC PANEL - Abnormal; Notable for the following components:   AST 12 (*)    All other components within normal limits  CBC - Abnormal; Notable for the following components:   WBC 11.2 (*)    Platelets 460 (*)    All other components within normal limits  LIPASE, BLOOD  PREGNANCY, URINE  RPR  HIV ANTIBODY (ROUTINE TESTING W REFLEX)  GC/CHLAMYDIA PROBE AMP ([place]) NOT AT [facility]    EKG None  Radiology CT Renal Stone Study  Result Date: 05/03/2020 CLINICAL DATA:  Hematuria of unknown cause. EXAM: CT ABDOMEN AND PELVIS WITHOUT CONTRAST TECHNIQUE: Multidetector CT imaging of the abdomen and pelvis was performed following the standard protocol without IV contrast. COMPARISON:  None. FINDINGS: Lower chest: No acute abnormality. Hepatobiliary: No focal liver abnormality is seen. No gallstones, gallbladder wall thickening, or biliary dilatation. Pancreas: Unremarkable. No pancreatic ductal dilatation or surrounding inflammatory changes. Spleen: Normal in  size without focal abnormality. Adrenals/Urinary Tract: No adrenal nodule bilaterally. No nephrolithiasis, no hydronephrosis, and no contour-deforming renal mass. No ureterolithiasis or hydroureter. The urinary bladder is unremarkable. Stomach/Bowel: Stomach is within normal limits. No evidence of bowel wall thickening or dilatation. Appendix appears normal. Vascular/Lymphatic: No significant vascular findings are present. No enlarged abdominal or pelvic lymph nodes. Reproductive: The uterus in anteverted. Uterus and bilateral adnexa are unremarkable. Other: Trace simple free fluid within the pelvis. No intraperitoneal free gas. No organized fluid collection. Musculoskeletal: No abdominal wall hernia or abnormality No suspicious lytic or blastic osseous lesions. No acute displaced fracture. IMPRESSION: No acute intra-abdominal or intrapelvic abnormality on this noncontrast study to explain etiology of patient's symptoms. Electronically Signed   By: Tish Frederickson M.D.   On: 05/03/2020 18:49    Procedures Pelvic exam  Date/Time: 05/03/2020 7:25 PM Performed by: Carroll Sage, PA-C Authorized by: Carroll Sage, PA-C  Preparation: Patient was prepped and draped in the usual sterile fashion. Local anesthesia used: no  Anesthesia: Local anesthesia used: no  Sedation: Patient sedated: no  Patient tolerance: patient tolerated the procedure well with no immediate complications    (including critical care time)  Medications Ordered in ED Medications  gentamicin (GARAMYCIN) injection 240 mg (has no administration in time range)  azithromycin (ZITHROMAX) tablet 2,000 mg (has no administration in time range)  oxyCODONE-acetaminophen (PERCOCET/ROXICET) 5-325 MG per tablet 1 tablet (has no administration in time range)  ondansetron (ZOFRAN-ODT) disintegrating tablet 4 mg (4 mg Oral Given 05/03/20 1644)  acetaminophen (TYLENOL) tablet 650 mg (650 mg Oral Given 05/03/20 1644)    ED Course   I have reviewed the triage vital signs and the nursing notes.  Pertinent labs & imaging results that were available during my care of the patient were reviewed by me and considered in my medical decision making (see chart for details).    MDM Rules/Calculators/A&P  Patient presents with UTI symptoms and pelvic pain.  She is alert, did not appear in acute distress, vital signs reassuring.  Will order screening labs, perform pelvic exam for further evaluation.  With chaperone present pelvic exam was performed no acute abnormalities noted but she had adnexal pain worse on the left versus the right.  We will proceed with CT renal for further evaluation as ultrasound is unavailable at this time.  CBC shows leukocytosis, without signs of anemia.  CMP negative for electrolyte abnormalities, no metabolic acidosis, no AKI, no elevated liver enzymes, no anion gap present.  Lipase is 23.  Wet prep reveals few white blood cells.  After reviewing patient's notes patient had a UA performed at urgent care which shows moderate blood, positive nitrates and leukocytes.  She has a urine culture pending at this time.  CT renal does not reveal any acute findings.  I have low suspicion for ovarian torsion or ectopic pregnancy as patient has a negative urinary pregnancy, CT renal does not reveal any acute findings.  Low suspicion for pyelonephritis as CT renal does not reveal stranding around the kidneys, no CVA tenderness noted on exam.  Low suspicion for systemic infection as patient is nontoxic-appearing.  I suspect slight leukocytosis is secondary to her UTI.  Low suspicion for PID but cannot exclude as she had right adnexal pain with vaginal discharge.  will treat her prophylactically for PID.  Due to her anaphylaxis to penicillin will use gentamicin and azithromycin for gonorrhea.  I suspect patient's pelvic pain and urinary symptoms are secondary to her UTI.  Will recommend that she continues  with her antibiotics as prescribed by her urgent care provider and follow-up with her PCP for further evaluation.  Vital signs have remained stable, no indication for hospital admission.  Patient discussed with attending and they agreed with assessment and plan.  Patient given at home care as well strict return precautions.  Patient verbalized that they understood agreed to said plan.  Final Clinical Impression(s) / ED Diagnoses Final diagnoses:  Lower urinary tract infectious disease  Pelvic pain in female    Rx / DC Orders ED Discharge Orders         Ordered    ondansetron (ZOFRAN) 4 MG tablet  Every 8 hours PRN        05/03/20 1916    doxycycline (VIBRAMYCIN) 100 MG capsule  2 times daily        05/03/20 1937           Barnie DelFaulkner, Darienne Belleau J, PA-C 05/03/20 1943    Jacalyn LefevreHaviland, Julie, MD 05/03/20 2314

## 2020-05-03 NOTE — Discharge Instructions (Addendum)
Urine culture sent.  We will call you with the results.   Push fluids and get plenty of rest.   Take antibiotic as directed and to completion Follow up with PCP if symptoms persists Return here or go to ER if you have any new or worsening symptoms such as fever, worsening abdominal pain, nausea/vomiting, flank pain, etc... 

## 2020-05-03 NOTE — ED Provider Notes (Addendum)
E. coli more likely [facility]   Chief Complaint  Patient presents with  . Flank Pain  . Abdominal Pain     SUBJECTIVE:  Barbara Vazquez is a 19 y.o. female who who presented to the urgent care for complaint of back pain and groin pressure for the past 2 to 3 days.  Patient denies a precipitating event, recent sexual encounter, excessive caffeine intake.  Localizes the pain to the lower back.  Pain is intermittent and described as achy.  Has tried OTC medications without relief.  Symptoms are made worse with urination.  Admits to similar symptoms in the past.  Denies fever, chills, nausea, vomiting, abdominal pain, flank pain, abnormal vaginal discharge or bleeding, hematuria.    LMP: Patient's last menstrual period was 04/26/2020.  ROS: As in HPI.  All other pertinent ROS negative.     Past Medical History:  Diagnosis Date  . Asthma   . Gonorrhea   . Mental disorder    anxiety  . Teenage parent    March 2020   Past Surgical History:  Procedure Laterality Date  . NO PAST SURGERIES     Allergies  Allergen Reactions  . Penicillins Anaphylaxis    As a baby, throat swelled, stopped breathing  . Amoxicillin Hives   No current facility-administered medications on file prior to encounter.   Current Outpatient Medications on File Prior to Encounter  Medication Sig Dispense Refill  . hydrocortisone 2.5 % cream Apply topically 2 (two) times daily. 30 g 2   Social History   Socioeconomic History  . Marital status: Single    Spouse name: Not on file  . Number of children: Not on file  . Years of education: Not on file  . Highest education level: High school graduate  Occupational History  . Not on file  Tobacco Use  . Smoking status: Former Smoker    Types: E-cigarettes  . Smokeless tobacco: Never Used  . Tobacco comment: once a week  Vaping Use  . Vaping Use: Former  Substance and Sexual Activity  . Alcohol use: No  . Drug use: No  . Sexual activity:  Not Currently    Birth control/protection: None  Other Topics Concern  . Not on file  Social History Narrative   Had first child at 23 years of age, March 2020    Social Determinants of Health   Financial Resource Strain:   . Difficulty of Paying Living Expenses: Not on file  Food Insecurity:   . Worried About Programme researcher, broadcasting/film/video in the Last Year: Not on file  . Ran Out of Food in the Last Year: Not on file  Transportation Needs:   . Lack of Transportation (Medical): Not on file  . Lack of Transportation (Non-Medical): Not on file  Physical Activity:   . Days of Exercise per Week: Not on file  . Minutes of Exercise per Session: Not on file  Stress:   . Feeling of Stress : Not on file  Social Connections:   . Frequency of Communication with Friends and Family: Not on file  . Frequency of Social Gatherings with Friends and Family: Not on file  . Attends Religious Services: Not on file  . Active Member of Clubs or Organizations: Not on file  . Attends Banker Meetings: Not on file  . Marital Status: Not on file  Intimate Partner Violence:   . Fear of Current or Ex-Partner: Not on file  . Emotionally Abused: Not  on file  . Physically Abused: Not on file  . Sexually Abused: Not on file   Family History  Problem Relation Age of Onset  . Lung disease Paternal Grandfather   . Cancer Paternal Grandmother        skin  . Lung disease Maternal Grandmother   . Lung disease Maternal Grandfather   . Lung disease Father   . Mental illness Father   . Asthma Mother   . Mental illness Mother   . ADD / ADHD Brother     OBJECTIVE:  Vitals:   05/03/20 0915  BP: 126/83  Pulse: 78  Resp: 20  Temp: 98 F (36.7 C)  SpO2: 96%   General appearance: AOx3 in no acute distress HEENT: NCAT.  Oropharynx clear.  Lungs: clear to auscultation bilaterally without adventitious breath sounds Heart: regular rate and rhythm.  Radial pulses 2+ symmetrical bilaterally Abdomen: soft;  non-distended; no tenderness; bowel sounds present; no guarding or rebound tenderness Back: no CVA tenderness Extremities: no edema; symmetrical with no gross deformities Skin: warm and dry Neurologic: Ambulates from chair to exam table without difficulty Psychological: alert and cooperative; normal mood and affect  Labs Reviewed  POCT URINALYSIS DIP (MANUAL ENTRY) - Abnormal; Notable for the following components:      Result Value   Blood, UA moderate (*)    Protein Ur, POC =100 (*)    Nitrite, UA Positive (*)    Leukocytes, UA Large (3+) (*)    All other components within normal limits  URINE CULTURE    ASSESSMENT & PLAN:  1. Episodic low back pain   2. Acute cystitis with hematuria     Meds ordered this encounter  Medications  . nitrofurantoin, macrocrystal-monohydrate, (MACROBID) 100 MG capsule    Sig: Take 1 capsule (100 mg total) by mouth 2 (two) times daily.    Dispense:  10 capsule    Refill:  0   Discharge instructions  Urine culture sent.  We will call you with the results.   Push fluids and get plenty of rest.   Take antibiotic as directed and to completion Follow up with PCP if symptoms persists Return here or go to ER if you have any new or worsening symptoms such as fever, worsening abdominal pain, nausea/vomiting, flank pain, etc...  Outlined signs and symptoms indicating need for more acute intervention. Patient verbalized understanding. After Visit Summary given.     Durward Parcel, FNP 05/03/20 0936    Durward Parcel, FNP 05/03/20 [phone]

## 2020-05-03 NOTE — Discharge Instructions (Addendum)
Seen for UTI symptoms and pelvic pain.  Provided you with antibiotics to treat you for gonorrhea and chlamydia.  Please abstain from sexual intercourse for the next 2 weeks until you get your results back on MyChart.  If they are positive, the hospital will call you and provide you with further information.  Please continue to take your antibiotics as prescribed by the urgent care.  I recommend over-the-counter pain medications like ibuprofen or Tylenol every 6 as needed please follow dosing back of bottle.  Please stay hydrated this can help with your urinary symptoms.  I recommend follow-up with your PCP or OB/GYN for further evaluation management.  Come back to the emergency department if you develop chest pain, shortness of breath, severe abdominal pain, uncontrolled nausea, vomiting, diarrhea.

## 2020-05-03 NOTE — ED Triage Notes (Signed)
Pt presents to ED with complaints of lower abdominal cramping. Pt states she was seen at Urgent Care and treated for UTI.

## 2020-05-03 NOTE — ED Triage Notes (Signed)
Pt presents with c/o back pain and also reports uterus pain , states that she has frequent utis, not concerned for STD or pregnancy

## 2020-05-04 LAB — URINE CULTURE

## 2020-05-04 LAB — GC/CHLAMYDIA PROBE AMP (~~LOC~~) NOT AT ARMC
Chlamydia: NEGATIVE
Comment: NEGATIVE
Comment: NORMAL
Neisseria Gonorrhea: NEGATIVE

## 2020-05-04 LAB — RPR: RPR Ser Ql: NONREACTIVE

## 2020-05-11 ENCOUNTER — Ambulatory Visit (INDEPENDENT_AMBULATORY_CARE_PROVIDER_SITE_OTHER): Payer: Medicaid Other | Admitting: Women's Health

## 2020-05-11 ENCOUNTER — Encounter: Payer: Self-pay | Admitting: Women's Health

## 2020-05-11 VITALS — BP 114/69 | HR 72 | Ht 64.0 in | Wt 228.0 lb

## 2020-05-11 DIAGNOSIS — R102 Pelvic and perineal pain: Secondary | ICD-10-CM

## 2020-05-11 DIAGNOSIS — R1032 Left lower quadrant pain: Secondary | ICD-10-CM

## 2020-05-11 DIAGNOSIS — M549 Dorsalgia, unspecified: Secondary | ICD-10-CM | POA: Diagnosis not present

## 2020-05-11 DIAGNOSIS — Z3202 Encounter for pregnancy test, result negative: Secondary | ICD-10-CM

## 2020-05-11 DIAGNOSIS — Z9189 Other specified personal risk factors, not elsewhere classified: Secondary | ICD-10-CM | POA: Diagnosis not present

## 2020-05-11 LAB — POCT URINALYSIS DIPSTICK
Blood, UA: NEGATIVE
Glucose, UA: NEGATIVE
Ketones, UA: NEGATIVE
Leukocytes, UA: NEGATIVE
Nitrite, UA: NEGATIVE
Protein, UA: NEGATIVE

## 2020-05-11 LAB — POCT URINE PREGNANCY: Preg Test, Ur: NEGATIVE

## 2020-05-11 NOTE — Progress Notes (Signed)
GYN VISIT Patient name: Barbara Vazquez MRN [id number]  Date of birth: 03-16-2001 Chief Complaint:   + cramping, nausea & back pain  History of Present Illness:   Barbara Vazquez is a 19 y.o. [id number] Caucasian female being seen today for report of intermittent lower back pain, uterine contractions and nausea that started 10/30. Went to Urgent Care 11/2, urine +nitrites, dx w/ UTI, given macrobid. Went to [facility] few hours later b/c developed fever of 103.8 at home, urine cx showed 'multiple species, suggest recollection', normal labs, normal wet prep and neg gc/ct. Had renal stone CT, which was normal including normal appendix and kidneys. Was tx prophylactically w/ gent and azithromycin, and given zofran and doxycycline. Last 'bad' pains she had was on Monday night.  States mom told her to ask about endometriosis as there is a strong family hx. Pt denies painful periods. Does have some pain w/ sex at time, no longer sexually active w/ female.  Depression screen [facility] 2/9 02/20/2018 01/15/2018 12/14/2016  Decreased Interest 3 0 1  Down, Depressed, Hopeless 2 1 2   PHQ - 2 Score 5 1 3   Altered sleeping 3 - 2  Tired, decreased energy 3 - 2  Change in appetite 0 - 0  Feeling bad or failure about yourself  2 - 2  Trouble concentrating 0 - 2  Moving slowly or fidgety/restless 1 - 1  Suicidal thoughts 0 - 1  PHQ-9 Score 14 - 13    Patient's last menstrual period was 04/26/2020. The current method of family planning is abstinence.  Last pap <21yo. Results were:  n/a Review of Systems:   Pertinent items are noted in HPI Denies fever/chills, dizziness, headaches, visual disturbances, fatigue, shortness of breath, chest pain, abdominal pain, vomiting, abnormal vaginal discharge/itching/odor/irritation, problems with periods, bowel movements, urination, or intercourse unless otherwise stated above.  Pertinent History Reviewed:  Reviewed past medical,surgical, social, obstetrical and family history.    Reviewed problem list, medications and allergies. Physical Assessment:   Vitals:   05/11/20 1433  BP: 114/69  Pulse: 72  Weight: 228 lb (103.4 kg)  Height: 5\' 4"  (1.626 m)  Body mass index is 39.14 kg/m.       Physical Examination:   General appearance: alert, well appearing, and in no distress  Mental status: alert, oriented to person, place, and time  Skin: warm & dry   Cardiovascular: normal heart rate noted  Respiratory: normal respiratory effort, no distress  Abdomen: soft, non-tender   Pelvic: VULVA: normal appearing vulva with no masses, tenderness or lesions, VAGINA: normal appearing vagina with normal color and discharge, no lesions, CERVIX: normal appearing cervix without discharge or lesions, UTERUS: uterus is normal size, shape, consistency and nontender, ADNEXA: normal adnexa in size, nontender and no masses, small amt tenderness LLQ  Extremities: no edema   Chaperone: 04/28/2020    Results for orders placed or performed in visit on 05/11/20 (from the past 24 hour(s))  POCT urine pregnancy   Collection Time: 05/11/20  2:54 PM  Result Value Ref Range   Preg Test, Ur Negative Negative  POCT Urinalysis Dipstick   Collection Time: 05/11/20  2:57 PM  Result Value Ref Range   Color, UA     Clarity, UA     Glucose, UA Negative Negative   Bilirubin, UA     Ketones, UA neg    Spec Grav, UA     Blood, UA neg    pH, UA     Protein, UA Negative  Negative   Urobilinogen, UA     Nitrite, UA neg    Leukocytes, UA Negative Negative   Appearance     Odor      Assessment & Plan:  1) Intermittent lower back/uterine pain w/ nausea> work-up neg in ED, has finished antibiotics for +nitrites at urgent care. Cx from ED returned multiple species. Will send another cx today. Wet prep and gc/ct in ED neg. Neg renal CT including appendix. Will get pelvic u/s.   Meds: No orders of the defined types were placed in this encounter.   Orders Placed This Encounter  Procedures  .  Urine Culture  . US PELVIS (TRANSABDOMINAL ONLY)  . US PELVIS TRANSVAGINAL NON-OB (TV ONLY)  . POCT urine pregnancy  . POCT Urinalysis Dipstick    Return for 1st available, US:GYN and f/u after w/ provider.  Cheral Marker CNM, [facility] 05/11/2020 5:07 PM

## 2020-05-14 LAB — URINE CULTURE

## 2020-06-01 DIAGNOSIS — Z419 Encounter for procedure for purposes other than remedying health state, unspecified: Secondary | ICD-10-CM | POA: Diagnosis not present

## 2020-06-02 ENCOUNTER — Ambulatory Visit: Payer: Medicaid Other | Admitting: Advanced Practice Midwife

## 2020-06-02 ENCOUNTER — Other Ambulatory Visit: Payer: Medicaid Other

## 2020-06-12 ENCOUNTER — Ambulatory Visit
Admission: EM | Admit: 2020-06-12 | Discharge: 2020-06-12 | Disposition: A | Discharge status: Home / Self Care | Payer: Medicaid Other | Attending: Family Medicine | Admitting: Family Medicine

## 2020-06-12 DIAGNOSIS — Z1152 Encounter for screening for COVID-19: Secondary | ICD-10-CM | POA: Diagnosis not present

## 2020-06-12 DIAGNOSIS — R0982 Postnasal drip: Secondary | ICD-10-CM | POA: Diagnosis not present

## 2020-06-12 DIAGNOSIS — J029 Acute pharyngitis, unspecified: Secondary | ICD-10-CM | POA: Diagnosis not present

## 2020-06-12 DIAGNOSIS — J3089 Other allergic rhinitis: Secondary | ICD-10-CM | POA: Diagnosis not present

## 2020-06-12 LAB — POCT RAPID STREP A (OFFICE): Rapid Strep A Screen: NEGATIVE

## 2020-06-12 MED ORDER — IPRATROPIUM BROMIDE 0.03 % NA SOLN: 2.0000 | Freq: Two times a day (BID) | NASAL | 0 refills | Status: DC

## 2020-06-12 MED ORDER — LEVOCETIRIZINE DIHYDROCHLORIDE 5 MG PO TABS: 5.0000 mg | ORAL_TABLET | Freq: Every evening | ORAL | 0 refills | Status: DC

## 2020-06-12 NOTE — ED Triage Notes (Signed)
Pt presents with cough and sore throat that began yesterday 

## 2020-06-13 NOTE — ED Provider Notes (Signed)
[facility]    CSN: [id number] Arrival date & time: 06/12/20  9983      History   Chief Complaint Chief Complaint  Patient presents with  . Sore Throat  . Cough    HPI Barbara Vazquez is a 19 y.o. female.   HPI  Patient presents for evaluation cough, sore throat, nasal congest x 1 day. Afebrile. No OTC treatment. No known exposure to COVID or flu.  Past Medical History:  Diagnosis Date  . Asthma   . Gonorrhea   . Mental disorder    anxiety  . Teenage parent    March 2020    Patient Active Problem List   Diagnosis Date Noted  . Gonorrhea 03/20/2019  . Postpartum care following vaginal delivery 10/27/2018  . Contraception management 10/27/2018  . 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 Asthma, chronic 01/06/2015  . Allergic conjunctivitis 10/13/2014  . 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 Seasonal allergies 10/28/2012    Past Surgical History:  Procedure Laterality Date  . NO PAST SURGERIES      OB History    Gravida  1   Para  1   Term  1   Preterm  0   AB  0   Living  1     SAB  0   IAB  0   Ectopic  0   Multiple  0   Live Births  1            Home Medications    Prior to Admission medications   Medication Sig Start Date End Date Taking? Authorizing Provider  hydrocortisone 2.5 % cream Apply topically 2 (two) times daily. Patient taking differently: Apply topically as needed.  09/16/19   Fredia Sorrow, NP  ipratropium (ATROVENT) 0.03 % nasal spray Place 2 sprays into both nostrils 2 (two) times daily. 06/12/20   Bing Neighbors, FNP  levocetirizine (XYZAL) 5 MG tablet Take 1 tablet (5 mg total) by mouth every evening. 06/12/20   Bing Neighbors, FNP  phenazopyridine (PYRIDIUM) 95 MG tablet Take 95 mg by mouth 3 (three) times daily as needed for pain.    [provider]    Family History Family History  Problem Relation Age of Onset  . Lung disease Paternal Grandfather   . Cancer  Paternal Grandmother        skin  . Lung disease Maternal Grandmother   . Lung disease Maternal Grandfather   . Lung disease Father   . Mental illness Father   . Asthma Mother   . Mental illness Mother   . ADD / ADHD Brother     Social History Social History   Tobacco Use  . Smoking status: Former Smoker    Types: E-cigarettes  . Smokeless tobacco: Never Used  . Tobacco comment: once a week  Vaping Use  . Vaping Use: Former  Substance Use Topics  . Alcohol use: No  . Drug use: No     Allergies   Penicillins and Amoxicillin   Review of Systems Review of Systems Pertinent negatives listed in HPI   Physical Exam Triage Vital Signs ED Triage Vitals [06/12/20 1002]  Enc Vitals Group     BP 122/82     Pulse Rate 70     Resp 20     Temp 98 F (36.7 C)     Temp src      SpO2 97 %     Weight  Height      Head Circumference      Peak Flow      Pain Score 4     Pain Loc      Pain Edu?      Excl. in GC?    No data found.  Updated Vital Signs BP 122/82   Pulse 70   Temp 98 F (36.7 C)   Resp 20   LMP 05/03/2020 (Approximate)   SpO2 97%   Visual Acuity Right Eye Distance:   Left Eye Distance:   Bilateral Distance:    Right Eye Near:   Left Eye Near:    Bilateral Near:     Physical Exam  General Appearance:    Alert, cooperative, no distress  HENT:   Normocephalic, ears normal, nares mucosal edema with congestion, rhinorrhea, oropharynx    Eyes:    PERRL, conjunctiva/corneas clear, EOM's intact       Lungs:     Clear to auscultation bilaterally, respirations unlabored  Heart:    Regular rate and rhythm  Neurologic:   Awake, alert, oriented x 3. No apparent focal neurological           defect.     UC Treatments / Results  Labs (all labs ordered are listed, but only abnormal results are displayed) Labs Reviewed  COVID-19, FLU A+B NAA  CULTURE, GROUP A STREP [facility])  POCT RAPID STREP A (OFFICE)    EKG   Radiology No results  found.  Procedures Procedures (including critical care time)  Medications Ordered in UC Medications - No data to display  Initial Impression / Assessment and Plan / UC Course  I have reviewed the triage vital signs and the nursing notes.  Pertinent labs & imaging results that were available during my care of the patient were reviewed by me and considered in my medical decision making (see chart for details).    Respiratory panel pending. Symptoms management pending. See discharge medication for treatment. Return precautions given. Final Clinical Impressions(s) / UC Diagnoses   Final diagnoses:  Allergic rhinitis due to other allergic trigger, unspecified seasonality  Post-nasal drip  Sore throat   Discharge Instructions   None    ED Prescriptions    Medication Sig Dispense Auth. Provider   ipratropium (ATROVENT) 0.03 % nasal spray Place 2 sprays into both nostrils 2 (two) times daily. 30 mL Bing Neighbors, FNP   levocetirizine (XYZAL) 5 MG tablet Take 1 tablet (5 mg total) by mouth every evening. 30 tablet Bing Neighbors, FNP     PDMP not reviewed this encounter.   Bing Neighbors, FNP 06/13/20 [phone]

## 2020-06-15 LAB — CULTURE, GROUP A STREP (THRC)

## 2020-07-02 DIAGNOSIS — Z419 Encounter for procedure for purposes other than remedying health state, unspecified: Secondary | ICD-10-CM | POA: Diagnosis not present

## 2020-08-02 DIAGNOSIS — Z419 Encounter for procedure for purposes other than remedying health state, unspecified: Secondary | ICD-10-CM | POA: Diagnosis not present

## 2020-08-30 DIAGNOSIS — Z419 Encounter for procedure for purposes other than remedying health state, unspecified: Secondary | ICD-10-CM | POA: Diagnosis not present

## 2020-09-30 DIAGNOSIS — Z419 Encounter for procedure for purposes other than remedying health state, unspecified: Secondary | ICD-10-CM | POA: Diagnosis not present

## 2020-10-23 ENCOUNTER — Ambulatory Visit
Admission: EM | Admit: 2020-10-23 | Discharge: 2020-10-23 | Disposition: A | Discharge status: Home / Self Care | Payer: Medicaid Other | Attending: Physician Assistant | Admitting: Physician Assistant

## 2020-10-23 ENCOUNTER — Encounter: Payer: Self-pay | Admitting: Physician Assistant

## 2020-10-23 DIAGNOSIS — H6591 Unspecified nonsuppurative otitis media, right ear: Secondary | ICD-10-CM | POA: Diagnosis not present

## 2020-10-23 DIAGNOSIS — H6501 Acute serous otitis media, right ear: Secondary | ICD-10-CM | POA: Diagnosis not present

## 2020-10-23 MED ORDER — DOXYCYCLINE HYCLATE 100 MG PO CAPS: 100.0000 mg | ORAL_CAPSULE | Freq: Two times a day (BID) | ORAL | 0 refills | Status: DC

## 2020-10-23 NOTE — Discharge Instructions (Addendum)
Treat with Doxycycline x 10 days. May use Ibuprofen to help with discomfort and also Mucinex for chest congestion with help with ear pain as well. Feel better. FU if worsens.

## 2020-10-23 NOTE — ED Provider Notes (Signed)
[facility]    CSN: [id number] Arrival date & time: 10/23/20  1001      History   Chief Complaint No chief complaint on file.   HPI Barbara Vazquez is a 20 y.o. female.   Who presents with a 4 day history of right ear pain and now the left. She feels like she is "under water". The pain kept her up last night and radiates to her jaw and top of her head. No fevers. She has chronic allergies, but states this is unusual for her. No cough.      Past Medical History:  Diagnosis Date  . Asthma   . Gonorrhea   . Mental disorder    anxiety  . Teenage parent    March 2020    Patient Active Problem List   Diagnosis Date Noted  . Gonorrhea 03/20/2019  . Postpartum care following vaginal delivery 10/27/2018  . Contraception management 10/27/2018  . 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 Asthma, chronic 01/06/2015  . Allergic conjunctivitis 10/13/2014  . 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 Seasonal allergies 10/28/2012    Past Surgical History:  Procedure Laterality Date  . NO PAST SURGERIES      OB History    Gravida  1   Para  1   Term  1   Preterm  0   AB  0   Living  1     SAB  0   IAB  0   Ectopic  0   Multiple  0   Live Births  1            Home Medications    Prior to Admission medications   Medication Sig Start Date End Date Taking? Authorizing Provider  hydrocortisone 2.5 % cream Apply topically 2 (two) times daily. Patient taking differently: Apply topically as needed.  09/16/19   Fredia Sorrow, NP  ipratropium (ATROVENT) 0.03 % nasal spray Place 2 sprays into both nostrils 2 (two) times daily. 06/12/20   Bing Neighbors, FNP  levocetirizine (XYZAL) 5 MG tablet Take 1 tablet (5 mg total) by mouth every evening. 06/12/20   Bing Neighbors, FNP  phenazopyridine (PYRIDIUM) 95 MG tablet Take 95 mg by mouth 3 (three) times daily as needed for pain.    [provider]    Family History Family  History  Problem Relation Age of Onset  . Lung disease Paternal Grandfather   . Cancer Paternal Grandmother        skin  . Lung disease Maternal Grandmother   . Lung disease Maternal Grandfather   . Lung disease Father   . Mental illness Father   . Asthma Mother   . Mental illness Mother   . ADD / ADHD Brother     Social History Social History   Tobacco Use  . Smoking status: Former Smoker    Types: E-cigarettes  . Smokeless tobacco: Never Used  . Tobacco comment: once a week  Vaping Use  . Vaping Use: Former  Substance Use Topics  . Alcohol use: No  . Drug use: No     Allergies   Penicillins and Amoxicillin   Review of Systems Review of Systems  All other systems reviewed and are negative.    Physical Exam Triage Vital Signs ED Triage Vitals  Enc Vitals Group     BP 10/23/20 1010 116/79     Pulse Rate 10/23/20 1010 72     Resp 10/23/20 1010  18     Temp 10/23/20 1010 97.6 F (36.4 C)     Temp Source 10/23/20 1010 Oral     SpO2 10/23/20 1010 98 %     Weight --      Height --      Head Circumference --      Peak Flow --      Pain Score 10/23/20 1012 5     Pain Loc --      Pain Edu? --      Excl. in GC? --    No data found.  Updated Vital Signs BP 116/79   Pulse 72   Temp 97.6 F (36.4 C) (Oral)   Resp 18   SpO2 98%   Visual Acuity Right Eye Distance:   Left Eye Distance:   Bilateral Distance:    Right Eye Near:   Left Eye Near:    Bilateral Near:     Physical Exam Vitals and nursing note reviewed.  Constitutional:      General: She is not in acute distress.    Appearance: Normal appearance. She is not ill-appearing.  HENT:     Head: Normocephalic and atraumatic.     Left Ear: Tympanic membrane normal.     Ears:     Comments: Mild right ear effusion noted with TM buldge    Nose: Congestion present. No rhinorrhea.  Cardiovascular:     Rate and Rhythm: Normal rate and regular rhythm.  Pulmonary:     Effort: Pulmonary effort is  normal.     Breath sounds: Normal breath sounds.  Skin:    General: Skin is warm and dry.     Findings: No rash.  Neurological:     General: No focal deficit present.     Mental Status: She is alert.  Psychiatric:        Mood and Affect: Mood normal.        Behavior: Behavior normal.      UC Treatments / Results  Labs (all labs ordered are listed, but only abnormal results are displayed) Labs Reviewed - No data to display  EKG   Radiology No results found.  Procedures Procedures (including critical care time)  Medications Ordered in UC Medications - No data to display  Initial Impression / Assessment and Plan / UC Course  I have reviewed the triage vital signs and the nursing notes.  Pertinent labs & imaging results that were available during my care of the patient were reviewed by me and considered in my medical decision making (see chart for details).     Right ear effusion, will cover with antibiotics and supportive care. FU if worsens.  Final Clinical Impressions(s) / UC Diagnoses   Final diagnoses:  None   Discharge Instructions   None    ED Prescriptions    None     PDMP not reviewed this encounter.   Riki Sheer, PA-C 10/23/20 1024

## 2020-10-23 NOTE — ED Triage Notes (Signed)
RT ear pain x 3 days and now feels like LT ear is starting to hurt.

## 2020-10-30 DIAGNOSIS — Z419 Encounter for procedure for purposes other than remedying health state, unspecified: Secondary | ICD-10-CM | POA: Diagnosis not present

## 2020-11-30 DIAGNOSIS — Z419 Encounter for procedure for purposes other than remedying health state, unspecified: Secondary | ICD-10-CM | POA: Diagnosis not present

## 2020-12-05 IMAGING — CT CT RENAL STONE PROTOCOL
2 of 4 series · 16 of 46 positions shown, 18 images · non-contrast
Comparison: None.

CLINICAL DATA: Hematuria of unknown cause.

EXAM:
CT ABDOMEN AND PELVIS WITHOUT CONTRAST
TECHNIQUE: Multidetector CT imaging of the abdomen and pelvis was performed
following the standard protocol without IV contrast.

[Series 2: axial st · axial · 0.78mm/px · z∈[+1029,+1499]mm · 13 of 106 slices shown, 15 images]
[im 6/106  soft-tissue]
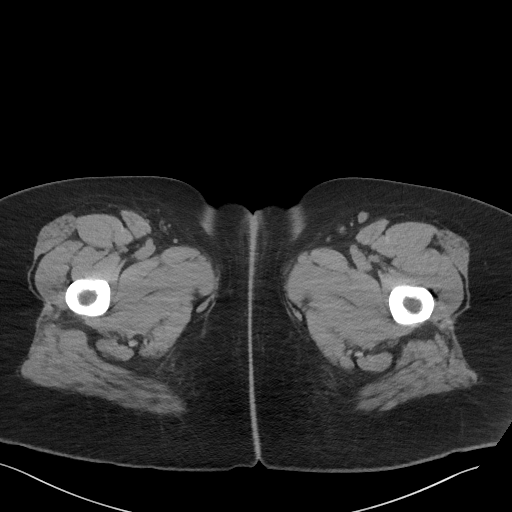
[im 6/106  bone]
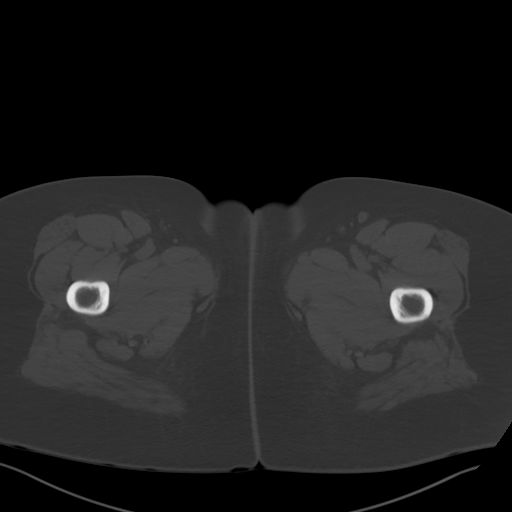
[im 17/106  soft-tissue]
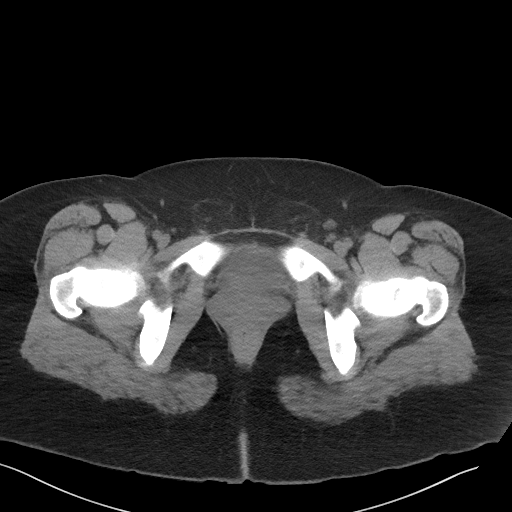
[im 23/106  soft-tissue]
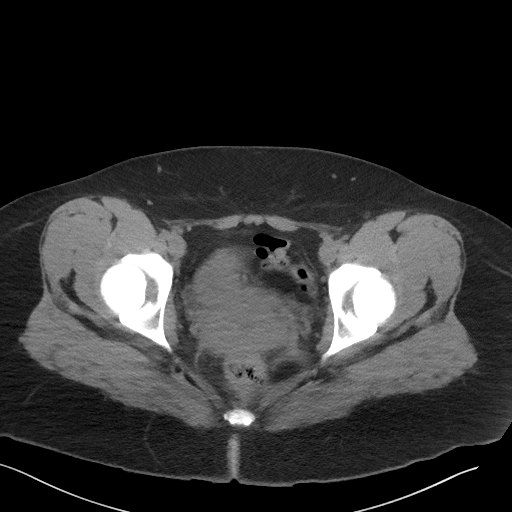
[im 28/106  soft-tissue]
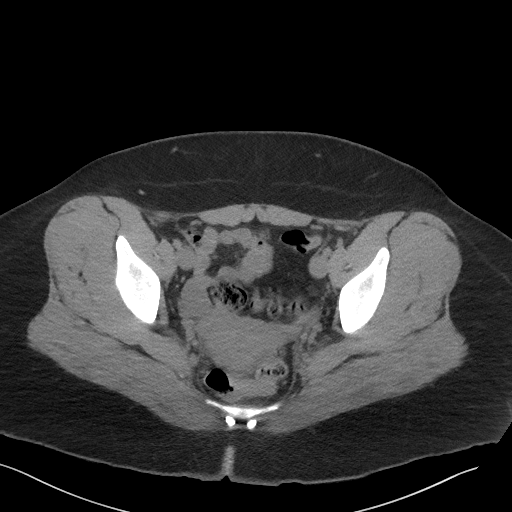
[im 39/106  soft-tissue]
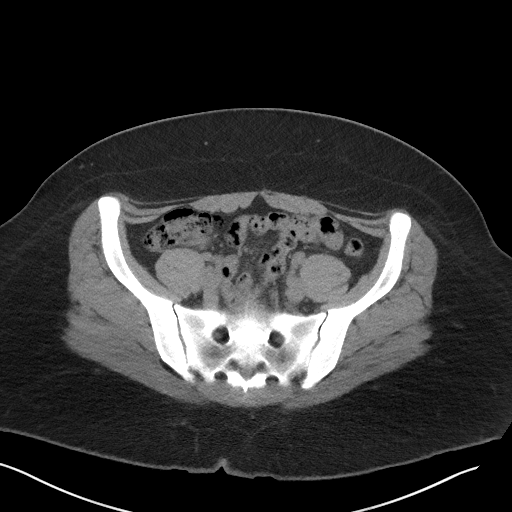
[im 45/106  soft-tissue]
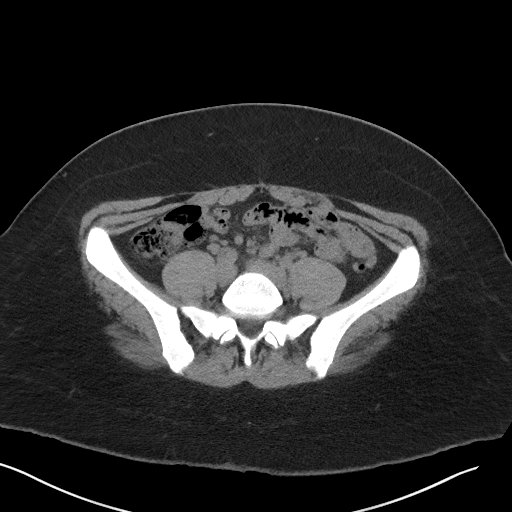
[im 56/106  soft-tissue]
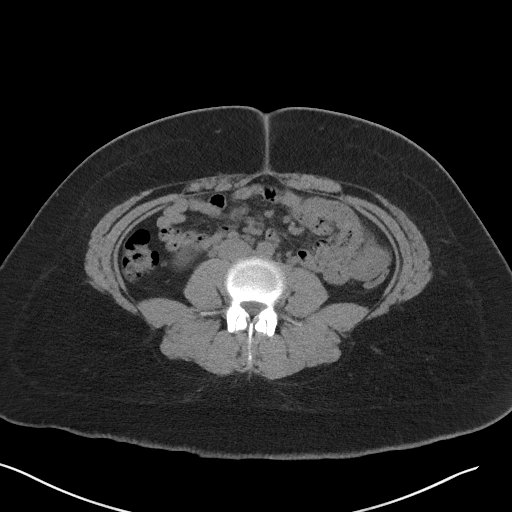
[im 61/106  soft-tissue]
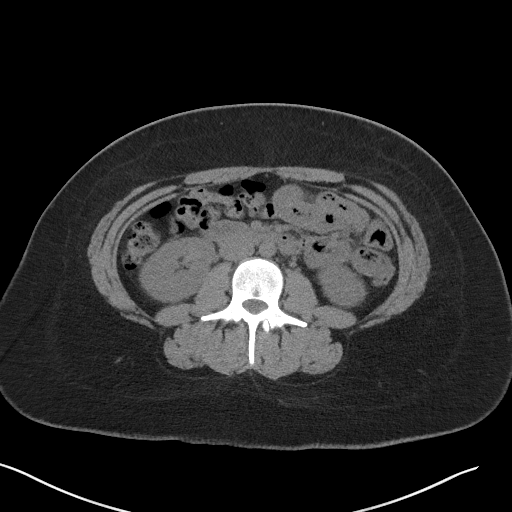
[im 67/106  soft-tissue]
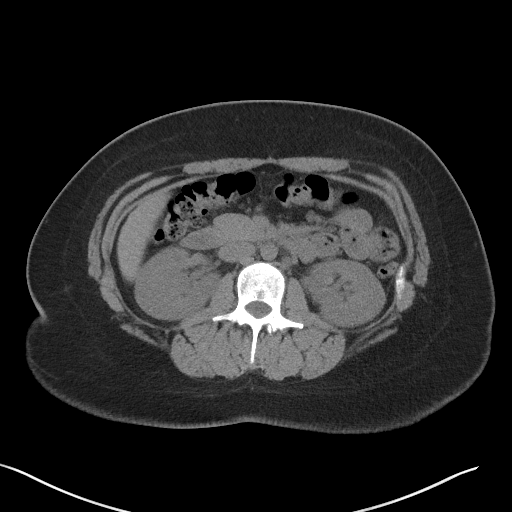
[im 67/106  bone]
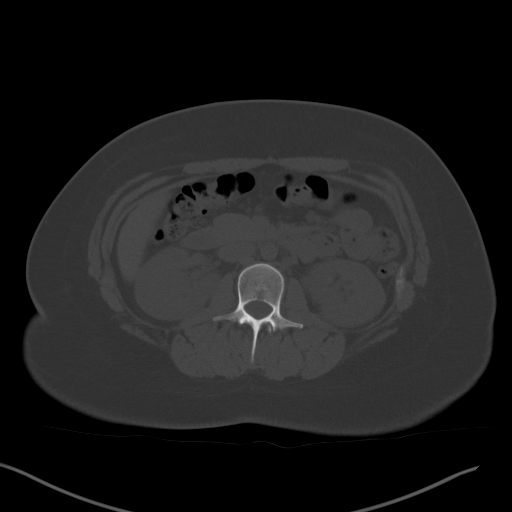
[im 78/106  soft-tissue]
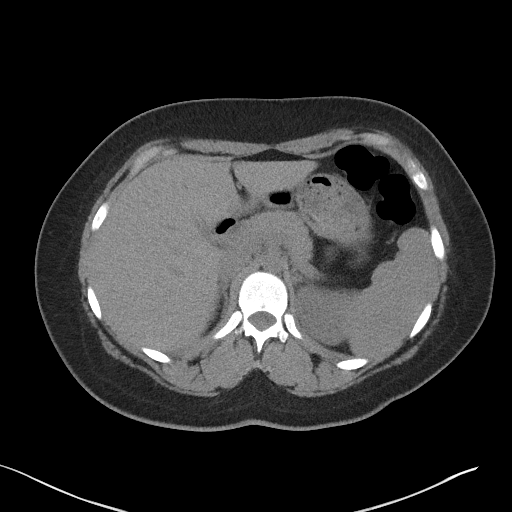
[im 83/106  soft-tissue]
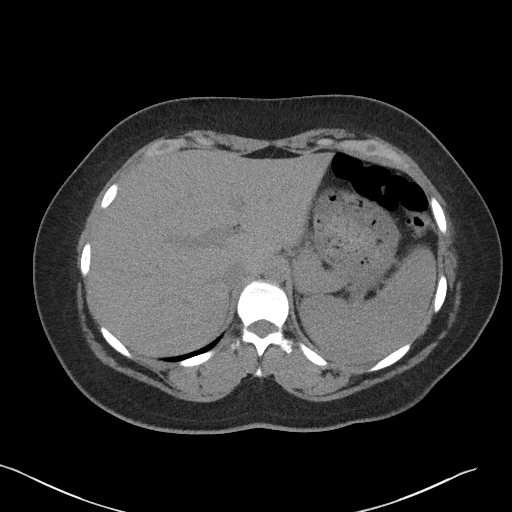
[im 89/106  soft-tissue]
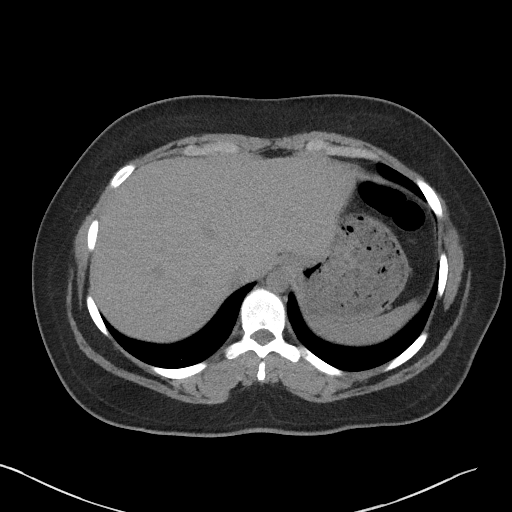
[im 100/106  soft-tissue]
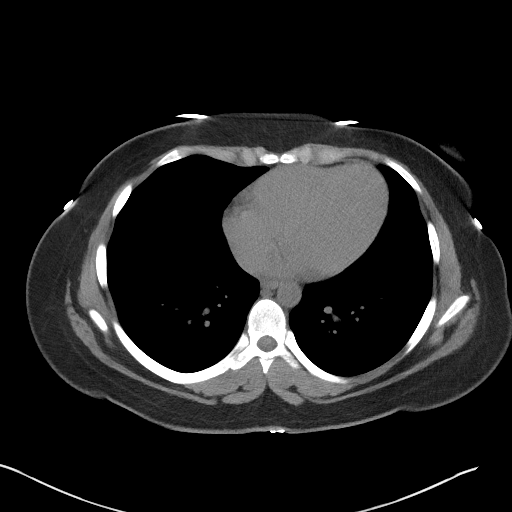

[Series 5: coronal st · coronal · 0.76mm/px · 3 of 95 slices shown]
[im 32/95  soft-tissue]
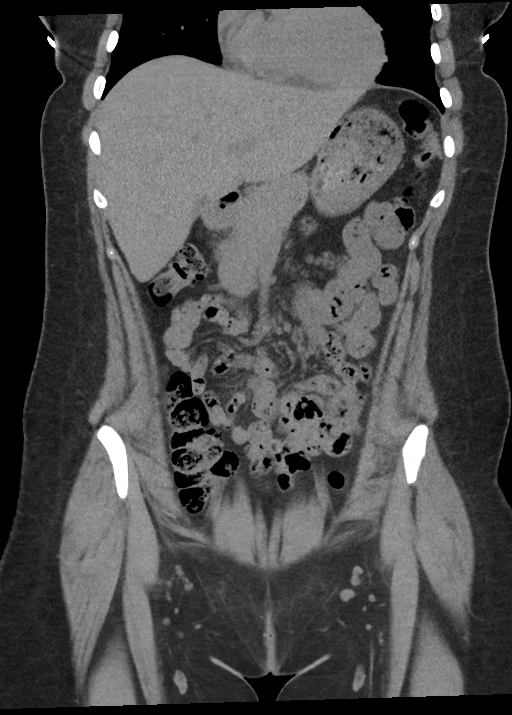
[im 42/95  soft-tissue]
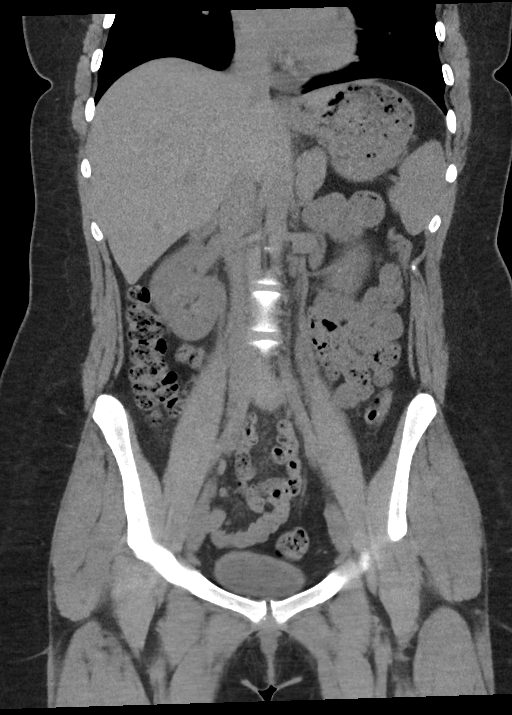
[im 53/95  soft-tissue]
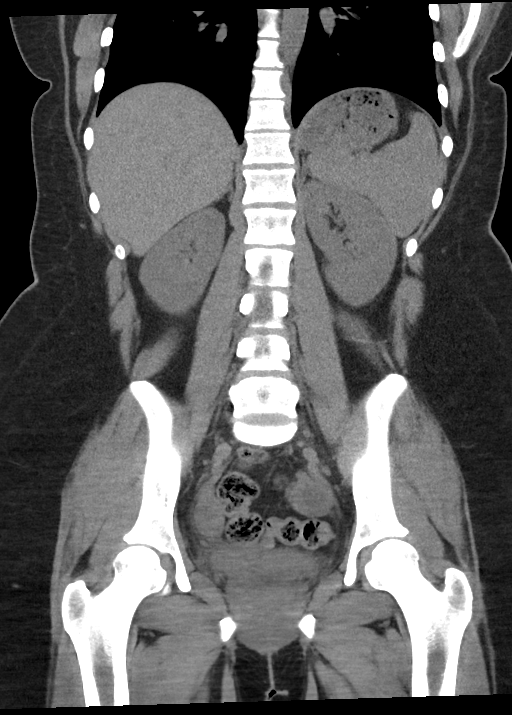

[16 of 46 positions shown; findings below may reference images not displayed]

FINDINGS: Lower chest: No acute abnormality.

Hepatobiliary: No focal liver abnormality is seen. No gallstones,
gallbladder wall thickening, or biliary dilatation.

Pancreas: Unremarkable. No pancreatic ductal dilatation or
surrounding inflammatory changes.

Spleen: Normal in size without focal abnormality.

Adrenals/Urinary Tract: No adrenal nodule bilaterally. No
nephrolithiasis, no hydronephrosis, and no contour-deforming renal
mass. No ureterolithiasis or hydroureter. The urinary bladder is
unremarkable.

Stomach/Bowel: Stomach is within normal limits. No evidence of bowel
wall thickening or dilatation. Appendix appears normal.

Vascular/Lymphatic: No significant vascular findings are present. No
enlarged abdominal or pelvic lymph nodes.

Reproductive: The uterus in anteverted. Uterus and bilateral adnexa
are unremarkable.

Other: Trace simple free fluid within the pelvis. No intraperitoneal
free gas. No organized fluid collection.

Musculoskeletal:

No abdominal wall hernia or abnormality

No suspicious lytic or blastic osseous lesions. No acute displaced
fracture.
IMPRESSION: No acute intra-abdominal or intrapelvic abnormality on this
noncontrast study to explain etiology of patient's symptoms.

## 2020-12-22 DIAGNOSIS — Z20822 Contact with and (suspected) exposure to covid-19: Secondary | ICD-10-CM | POA: Diagnosis not present

## 2020-12-30 DIAGNOSIS — Z419 Encounter for procedure for purposes other than remedying health state, unspecified: Secondary | ICD-10-CM | POA: Diagnosis not present

## 2021-02-09 DIAGNOSIS — J029 Acute pharyngitis, unspecified: Secondary | ICD-10-CM | POA: Diagnosis not present

## 2021-02-09 DIAGNOSIS — R070 Pain in throat: Secondary | ICD-10-CM | POA: Diagnosis not present

## 2021-02-09 DIAGNOSIS — Z20822 Contact with and (suspected) exposure to covid-19: Secondary | ICD-10-CM | POA: Diagnosis not present

## 2021-11-02 ENCOUNTER — Encounter: Payer: Self-pay | Admitting: *Deleted

## 2022-07-06 ENCOUNTER — Ambulatory Visit (INDEPENDENT_AMBULATORY_CARE_PROVIDER_SITE_OTHER): Payer: Medicaid Other | Admitting: Allergy & Immunology

## 2022-07-06 ENCOUNTER — Encounter: Payer: Self-pay | Admitting: Allergy & Immunology

## 2022-07-06 VITALS — BP 126/72 | HR 111 | Temp 98.5°F | Resp 16 | Ht 65.5 in | Wt 215.1 lb

## 2022-07-06 DIAGNOSIS — J3089 Other allergic rhinitis: Secondary | ICD-10-CM | POA: Diagnosis not present

## 2022-07-06 DIAGNOSIS — J4520 Mild intermittent asthma, uncomplicated: Secondary | ICD-10-CM

## 2022-07-06 DIAGNOSIS — T783XXA Angioneurotic edema, initial encounter: Secondary | ICD-10-CM | POA: Diagnosis not present

## 2022-07-06 DIAGNOSIS — L500 Allergic urticaria: Secondary | ICD-10-CM

## 2022-07-06 DIAGNOSIS — T783XXD Angioneurotic edema, subsequent encounter: Secondary | ICD-10-CM

## 2022-07-06 DIAGNOSIS — J302 Other seasonal allergic rhinitis: Secondary | ICD-10-CM

## 2022-07-06 MED ORDER — AZELASTINE HCL 0.1 % NA SOLN: NASAL | 5 refills | Status: DC

## 2022-07-06 NOTE — Progress Notes (Signed)
NEW PATIENT  Date of Service/Encounter:  07/06/22  Consult requested by: Patient, No Pcp Per   Assessment:   Mild intermittent asthma, uncomplicated   Seasonal and perennial allergic rhinitis (grasses, ragweed, weeds, trees, indoor molds, outdoor molds, and dust mites)  Allergic urticaria with angioedema - getting autoimmune labs due to family history of autoimmunity  Plan/Recommendations:   1. Seasonal and perennial allergic rhinitis - Testing today showed: grasses, ragweed, weeds, trees, indoor molds, outdoor molds, and dust mites - THE CAT IS SAFE!  - Copy of test results provided.  - Avoidance measures provided. - Continue with: alternating antihistamines like you are doing - Start taking: Flonase (fluticasone) one spray per nostril daily (AIM FOR EAR ON EACH SIDE) and Astelin (azelastine) 2 sprays per nostril 1-2 times daily as needed - You can use an extra dose of the antihistamine, if needed, for breakthrough symptoms.  - Consider nasal saline rinses 1-2 times daily to remove allergens from the nasal cavities as well as help with mucous clearance (this is especially helpful to do before the nasal sprays are given) - Strongly consider allergy shots as a means of long-term control. - Allergy shots "re-train" and "reset" the immune system to ignore environmental allergens and decrease the resulting immune response to those allergens (sneezing, itchy watery eyes, runny nose, nasal congestion, etc).    - Allergy shots improve symptoms in 75-85% of patients.  - We can discuss more at the next appointment if the medications are not working for you.   2. Allergic urticaria and angioedema - Your history does not have any "red flags" such as fevers, joint pains, or permanent skin changes that would be concerning for a more serious cause of hives.  - We will get some labs to rule out serious causes of hives: alpha gal panel, complete blood count, tryptase level, chronic urticaria  panel, CMP, ESR, and CRP as well as ANA. - Chronic hives are often times a self limited process and will "burn themselves out" over 6-12 months, although this is not always the case.  - In the meantime, start suppressive dosing of antihistamines:   - Morning: Allegra (fexofenadine) 360mg  (two tablets) + Pepcid (famotidine) 20mg   - Evening: Zyrtec (cetirizine) 20mg  (two tablets) +Pepcid (famotidine) 20mg  - You can change this dosing at home, decreasing the dose as needed or increasing the dosing as needed.  - If you are not tolerating the medications or are tired of taking them every day, we can start treatment with a monthly injectable medication called Xolair.   3. Mild intermittent asthma, uncomplicated - Lung testing looked awesome. - I do not think that we need a controller medication at this time. - Continue with albuterol as needed.   4. Return in about 6 weeks (around 08/17/2022).    This note in its entirety was forwarded to the Provider who requested this consultation.  Subjective:   Barbara Vazquez is a 22 y.o. female presenting today for evaluation of  Chief Complaint  Patient presents with   Allergic Reaction    Went to her dr for cysts on her breasts. After that appt she woke up with welts all over, hands will swell up, knots over the body. Has tried multiple changes in her lifestyle. Is still breaking out in hives. One morning woke up with lips/throat swollen went to the ER. They gave her meds and once she came off she had the reaction again. She passed out and had the reaction all over again  on Dec.27. Once she got to the ER she passed out again and they gave her prednisone and pepcid.    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  Barbara Vazquez was referred by Patient, No Pcp Per.     Barbara Vazquez is a 22 y.o. female presenting for an evaluation of hives and swelling .  She has had 6 months or so of foot pain. This started in the summer and she thought this was secondary to increased exercise.   She started having whlps in November  2023. She has gone to the ED three times. She had some blood work that show "hypothyroidism", but she has never seen an Musician.   Her mother Hashimoto's thyroiditis. Her father has hypothyroidism as well.   She has only been given prednisone, which she has been on for over one month. She is tarting to have hives and swelling now that she stopped htem a few days. She is taking Benarly only when she goes to the hospital.   The last major breakout was December 27th. She ended up passing out after getting up to use the restroom at 7am. She blacked out on the toilet.   She has not had diarrhea with this. She denies stomach pain or vomiting. She has had some throat swelling.   She is on a keto diet right now. She did try going off of red meat. She has been using low carb foods. She uses sugar substitutes. She was off of the red meat for one week and she still had the reaction. She tried getting rid of seafood and chicken and continued to have reactions. She tolerates nuts without a problem. She has tolerated eggs, but she has stomach pain with scrambled eggs or an omelette.   Asthma/Respiratory Symptom History: She had asthma when she was in middle school and she eventually got off of her medication.   Allergic Rhinitis Symptom History: She has environmental allergies and she has been on an allergy pill for as long as she can remember. Summer is bad with  itchy eyes and rhinorrhea. She is on cetirizine 10mg  daily for this. She switches every few months. She does use Flonase.   Otherwise, there is no history of other atopic diseases, including drug allergies, stinging insect allergies, or contact dermatitis. There is no significant infectious history. Vaccinations are up to date.    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 If you have any questions, ask your nurse or doctor.          azelastine 0.1 % nasal spray Commonly known as: ASTELIN Place 2 sprays per nostril 1-2 times daily as needed Started by: Valentina Shaggy, MD   diphenhydrAMINE 25 mg capsule Commonly known as: BENADRYL Take by mouth.   doxycycline 100 MG capsule Commonly known as: VIBRAMYCIN Take 1 capsule (100 mg total) by mouth 2 (two) times daily.   EPINEPHrine 0.3 mg/0.3 mL Soaj injection Commonly known as: EPI-PEN Inject into the muscle.   famotidine 20 MG tablet Commonly known as: PEPCID Take 20 mg by mouth 2 (two) times daily.   fluticasone 50 MCG/ACT nasal spray Commonly known as: FLONASE Place 2 sprays into both nostrils daily.   hydrocortisone 2.5 % cream Apply topically 2 (two) times daily.   ipratropium 0.03 % nasal spray Commonly known as: ATROVENT Place 2 sprays into both nostrils 2 (two) times daily.   levocetirizine 5 MG tablet Commonly known as: Xyzal Take 1 tablet  (5 mg total) by mouth every evening.   phenazopyridine 95 MG tablet Commonly known as: PYRIDIUM Take 95 mg by mouth 3 (three) times daily as needed for pain.   predniSONE 20 MG tablet Commonly known as: DELTASONE Take 20 mg by mouth 2 (two) times daily.        Birth History: non-contributory  Developmental History: non-contributory  Past Surgical History: Past Surgical History:  Procedure Laterality Date   NO PAST SURGERIES       Family History: Family History  Problem Relation Age of Onset   Eczema Mother    Asthma Mother    Mental illness Mother    Lung disease Father    Mental illness Father    ADD / ADHD Brother    Asthma Maternal Grandmother    Lung disease Maternal Grandmother    Lung disease Maternal Grandfather    Cancer Paternal Grandmother        skin   Lung disease Paternal Grandfather      Social History: Barbara Vazquez lives at home with her daughter.  They live in an apartment of unknown age.  There is no mold or mildew damage.  There is carpeting throughout the home.  They have electric heating and central cooling.  There are 3 cats inside of the home. There are no dust mite coverings on the bedding. There is tobacco exposure in the home as well as the car. She currently works for [employer]. She is getting an Associate's Degree in early childhood education and is going to get a Water quality scientist from [place].    Review of Systems  Constitutional: Negative.  Negative for chills, fever, malaise/fatigue and weight loss.  HENT: Negative.  Negative for congestion, ear discharge and ear pain.   Eyes:  Negative for pain, discharge and redness.  Respiratory:  Negative for cough, sputum production, shortness of breath and wheezing.   Cardiovascular: Negative.  Negative for chest pain and palpitations.  Gastrointestinal:  Negative for abdominal pain, constipation, diarrhea, heartburn, nausea and vomiting.  Skin:  Positive for itching and rash.  Neurological:  Negative for  dizziness and headaches.  Endo/Heme/Allergies:  Positive for environmental allergies. Does not bruise/bleed easily.       Objective:   Blood pressure 126/72, pulse (!) 111, temperature 98.5 F (36.9 C), resp. rate 16, height 5' 5.5" (1.664 m), weight 215 lb 2 oz (97.6 kg), SpO2 95 %. Body  mass index is 35.25 kg/m.     Physical Exam Vitals reviewed.  Constitutional:      Appearance: She is well-developed.  HENT:     Head: Normocephalic and atraumatic.     Right Ear: Tympanic membrane, ear canal and external ear normal. No drainage, swelling or tenderness. Tympanic membrane is not injected, scarred, erythematous, retracted or bulging.     Left Ear: Tympanic membrane, ear canal and external ear normal. No drainage, swelling or tenderness. Tympanic membrane is not injected, scarred, erythematous, retracted or bulging.     Nose: No nasal deformity, septal deviation, mucosal edema or rhinorrhea.     Right Turbinates: Enlarged, swollen and pale.     Left Turbinates: Enlarged, swollen and pale.     Right Sinus: No maxillary sinus tenderness or frontal sinus tenderness.     Left Sinus: No maxillary sinus tenderness or frontal sinus tenderness.     Comments: No nasal polyps noted.     Mouth/Throat:     Mouth: Mucous membranes are not pale and not dry.     Pharynx: Uvula midline.  Eyes:     General:        Right eye: No discharge.        Left eye: No discharge.     Conjunctiva/sclera: Conjunctivae normal.     Right eye: Right conjunctiva is not injected. No chemosis.    Left eye: Left conjunctiva is not injected. No chemosis.    Pupils: Pupils are equal, round, and reactive to light.  Cardiovascular:     Rate and Rhythm: Normal rate and regular rhythm.     Heart sounds: Normal heart sounds.  Pulmonary:     Effort: Pulmonary effort is normal. No tachypnea, accessory muscle usage or respiratory distress.     Breath sounds: Normal breath sounds. No wheezing, rhonchi or rales.  Chest:      Chest wall: No tenderness.  Abdominal:     Tenderness: There is no abdominal tenderness. There is no guarding or rebound.  Lymphadenopathy:     Head:     Right side of head: No submandibular, tonsillar or occipital adenopathy.     Left side of head: No submandibular, tonsillar or occipital adenopathy.     Cervical: No cervical adenopathy.  Skin:    General: Skin is warm.     Capillary Refill: Capillary refill takes less than 2 seconds.     Coloration: Skin is not pale.     Findings: No abrasion, erythema, petechiae or rash. Rash is not papular, urticarial or vesicular.     Comments: Excoriations present over the entire body. There are some isolated urticaria present over the entire body, sparing the back.   Neurological:     Mental Status: She is alert.      Diagnostic studies:    Spirometry: results normal (FEV1: 3.09/90%, FVC: 3.58/91%, FEV1/FVC: 86%).    Spirometry consistent with normal pattern.   Allergy Studies: none   Airborne Adult Perc - 07/06/22 0942     Time Antigen Placed [id number]    Allergen Manufacturer Lavella Hammock    Location Back    Number of Test 59    1. Control-Buffer 50% Glycerol Negative    2. Control-Histamine 1 mg/ml 2+    3. Albumin saline Negative    4. [place] 3+    5. [place] Negative    6. Johnson Negative    7. [place] Blue 2+    8. Meadow Fescue Negative    9. Perennial  Rye 3+    10. Sweet Vernal 2+    11. Timothy 3+    12. Cocklebur Negative    13. Burweed Marshelder Negative    14. Ragweed, short Negative    15. Ragweed, Giant Negative    16. Plantain,  English --   +/-   17. Lamb's Quarters Negative    18. Sheep Sorrell Negative    19. Rough Pigweed Negative    20. Marsh Elder, Rough Negative    21. Mugwort, Common Negative    22. Ash mix Negative    23. Birch mix Negative    24. Beech American Negative    25. Box, Elder Negative    26. Cedar, red Negative    27. Cottonwood, [place] Negative    28. Elm mix Negative    29. Hickory  Negative    30. Maple mix 3+    31. Oak, [place] mix Negative    32. Pecan Pollen 4+    33. Pine mix Negative    34. Sycamore Eastern Negative    35. Walnut, Black Pollen Negative    36. Alternaria alternata 2+    37. Cladosporium Herbarum Negative    38. Aspergillus mix Negative    39. Penicillium mix Negative    40. Bipolaris sorokiniana (Helminthosporium) Negative    41. Drechslera spicifera (Curvularia) Negative    42. Mucor plumbeus Negative    43. Fusarium moniliforme Negative    44. Aureobasidium pullulans (pullulara) Negative    45. Rhizopus oryzae Negative    46. Botrytis cinera Negative    47. Epicoccum nigrum Negative    48. Phoma betae Negative    49. Candida Albicans Negative    50. Trichophyton mentagrophytes Negative    51. Mite, D Farinae  5,000 AU/ml 4+    52. Mite, D Pteronyssinus  5,000 AU/ml 4+    53. Cat Hair 10,000 BAU/ml Negative    54.  Dog Epithelia Negative    55. Mixed Feathers Negative    56. Horse Epithelia Negative    57. Cockroach, German Negative    58. Mouse Negative    59. Tobacco Leaf Negative             Intradermal - 07/06/22 1001     Time Antigen Placed 1001    Allergen Manufacturer Greer    Location Arm    Number of Test 10    Control Negative    [place] 4+    Ragweed mix 4+    Weed mix 3+    Mold 2 3+    Mold 3 3+    Mold 4 3+    Cat Negative    Dog Negative    Cockroach Negative             Food Adult Perc - 07/06/22 0900     Time Antigen Placed [id number]    Allergen Manufacturer Waynette ButteryGreer    Location Back    Number of allergen test 17    1. Peanut Negative    2. Soybean Negative    3. Wheat Negative    4. Sesame Negative    5. Milk, cow Negative    6. Egg White, Chicken --   +/-   7. Casein Negative    8. Shellfish Mix Negative    9. Fish Mix Negative    10. Cashew Negative    11. Pecan Food Negative    12. Walnut Food Negative    13. Almond Negative  14. Hazelnut Negative    15. [place] nut Negative     16. Coconut Negative    17. Pistachio Negative                  Salvatore Marvel, MD [facility]

## 2022-07-06 NOTE — Patient Instructions (Addendum)
1. Seasonal and perennial allergic rhinitis - Testing today showed: grasses, ragweed, weeds, trees, indoor molds, outdoor molds, and dust mites - THE CAT IS SAFE!  - Copy of test results provided.  - Avoidance measures provided. - Continue with: alternating antihistamines like you are doing - Start taking: Flonase (fluticasone) one spray per nostril daily (AIM FOR EAR ON EACH SIDE) and Astelin (azelastine) 2 sprays per nostril 1-2 times daily as needed - You can use an extra dose of the antihistamine, if needed, for breakthrough symptoms.  - Consider nasal saline rinses 1-2 times daily to remove allergens from the nasal cavities as well as help with mucous clearance (this is especially helpful to do before the nasal sprays are given) - Strongly consider allergy shots as a means of long-term control. - Allergy shots "re-train" and "reset" the immune system to ignore environmental allergens and decrease the resulting immune response to those allergens (sneezing, itchy watery eyes, runny nose, nasal congestion, etc).    - Allergy shots improve symptoms in 75-85% of patients.  - We can discuss more at the next appointment if the medications are not working for you.   2. Allergic urticaria and angioedema - Your history does not have any "red flags" such as fevers, joint pains, or permanent skin changes that would be concerning for a more serious cause of hives.  - We will get some labs to rule out serious causes of hives: alpha gal panel, complete blood count, tryptase level, chronic urticaria panel, CMP, ESR, and CRP as well as ANA. - Chronic hives are often times a self limited process and will "burn themselves out" over 6-12 months, although this is not always the case.  - In the meantime, start suppressive dosing of antihistamines:   - Morning: Allegra (fexofenadine) 360mg  (two tablets) + Pepcid (famotidine) 20mg   - Evening: Zyrtec (cetirizine) 20mg  (two tablets) +Pepcid (famotidine) 20mg  - You  can change this dosing at home, decreasing the dose as needed or increasing the dosing as needed.  - If you are not tolerating the medications or are tired of taking them every day, we can start treatment with a monthly injectable medication called Xolair.   3. Mild intermittent asthma, uncomplicated - Lung testing looked awesome. - I do not think that we need a controller medication at this time. - Continue with albuterol as needed.   4. Return in about 6 weeks (around 08/17/2022).    Please inform of any Emergency Department visits, hospitalizations, or changes in symptoms. Call before going to the ED for breathing or allergy symptoms since we might be able to fit you in for a sick visit. Feel free to contact anytime with any questions, problems, or concerns.  It was a pleasure to meet you today!  Websites that have reliable patient information: 1. American Academy of Asthma, Allergy, and Immunology: www.aaaai.org 2. Food Allergy Research and Education (FARE): foodallergy.org 3. Mothers of Asthmatics: http://www.asthmacommunitynetwork.org 4. American College of Allergy, Asthma, and Immunology: www.acaai.org   COVID-19 Vaccine Information can be found at: 08/19/2022 For questions related to vaccine distribution or appointments, please email [email or web address] or call [phone].   We realize that you might be concerned about having an allergic reaction to the COVID19 vaccines. To help with that concern, WE ARE OFFERING THE COVID19 VACCINES IN OUR OFFICE! Ask the front desk for dates!     "Like" [place] on Facebook and Instagram for our latest updates!      A healthy democracy works  best when ALL voters participate! Make sure you are registered to vote! If you have moved or changed any of your contact information, you will need to get this updated before voting!  In some cases, you MAY be able to register to  vote online: [email or web address]     Airborne Adult Perc - 07/06/22 0942     Time Antigen Placed 9622    Allergen Manufacturer Lavella Hammock    Location Back    Number of Test 59    1. Control-Buffer 50% Glycerol Negative    2. Control-Histamine 1 mg/ml 2+    3. Albumin saline Negative    4. [place] 3+    5. [place] Negative    6. Johnson Negative    7. [place] Blue 2+    8. Meadow Fescue Negative    9. Perennial Rye 3+    10. Sweet Vernal 2+    11. Timothy 3+    12. Cocklebur Negative    13. Burweed Marshelder Negative    14. Ragweed, short Negative    15. Ragweed, Giant Negative    16. Plantain,  English --   +/-   17. Lamb's Quarters Negative    18. Sheep Sorrell Negative    19. Rough Pigweed Negative    20. Marsh Elder, Rough Negative    21. Mugwort, Common Negative    22. Ash mix Negative    23. Birch mix Negative    24. Beech American Negative    25. Box, Elder Negative    26. Cedar, red Negative    27. Cottonwood, [place] Negative    28. Elm mix Negative    29. Hickory Negative    30. Maple mix 3+    31. Oak, [place] mix Negative    32. Pecan Pollen 4+    33. Pine mix Negative    34. Sycamore Eastern Negative    35. Clarence, Black Pollen Negative    36. Alternaria alternata 2+    37. Cladosporium Herbarum Negative    38. Aspergillus mix Negative    39. Penicillium mix Negative    40. Bipolaris sorokiniana (Helminthosporium) Negative    41. Drechslera spicifera (Curvularia) Negative    42. Mucor plumbeus Negative    43. Fusarium moniliforme Negative    44. Aureobasidium pullulans (pullulara) Negative    45. Rhizopus oryzae Negative    46. Botrytis cinera Negative    47. Epicoccum nigrum Negative    48. Phoma betae Negative    49. Candida Albicans Negative    50. Trichophyton mentagrophytes Negative    51. Mite, D Farinae  5,000 AU/ml 4+    52. Mite, D Pteronyssinus  5,000 AU/ml 4+    53. Cat Hair 10,000 BAU/ml Negative    54.   Dog Epithelia Negative    55. Mixed Feathers Negative    56. Horse Epithelia Negative    57. Cockroach, German Negative    58. Mouse Negative    59. Tobacco Leaf Negative             Intradermal - 07/06/22 1001     Time Antigen Placed 1001    Allergen Manufacturer Lavella Hammock    Location Arm    Number of Test 10    Control Negative    [place] 4+    Ragweed mix 4+    Weed mix 3+    Mold 2 3+    Mold 3 3+    Mold 4 3+    Cat Negative  Dog Negative    Cockroach Negative             Food Adult Perc - 07/06/22 0900     Time Antigen Placed 2979    Allergen Manufacturer Lavella Hammock    Location Back    Number of allergen test 17    1. Peanut Negative    2. Soybean Negative    3. Wheat Negative    4. Sesame Negative    5. Milk, cow Negative    6. Egg White, Chicken --   +/-   7. Casein Negative    8. Shellfish Mix Negative    9. Fish Mix Negative    10. Cashew Negative    11. Pecan Food Negative    12. Sumner Negative    13. Almond Negative    14. Hazelnut Negative    15. [place] nut Negative    16. Coconut Negative    17. Pistachio Negative             Reducing Pollen Exposure  The American Academy of Allergy, Asthma and Immunology suggests the following steps to reduce your exposure to pollen during allergy seasons.    Do not hang sheets or clothing out to dry; pollen may collect on these items. Do not mow lawns or spend time around freshly cut grass; mowing stirs up pollen. Keep windows closed at night.  Keep car windows closed while driving. Minimize morning activities outdoors, a time when pollen counts are usually at their highest. Stay indoors as much as possible when pollen counts or humidity is high and on windy days when pollen tends to remain in the air longer. Use air conditioning when possible.  Many air conditioners have filters that trap the pollen spores. Use a HEPA room air filter to remove pollen form the indoor air you breathe.  Control  of Mold Allergen   Mold and fungi can grow on a variety of surfaces provided certain temperature and moisture conditions exist.  Outdoor molds grow on plants, decaying vegetation and soil.  The major outdoor mold, Alternaria and Cladosporium, are found in very high numbers during hot and dry conditions.  Generally, a late Summer - Fall peak is seen for common outdoor fungal spores.  Rain will temporarily lower outdoor mold spore count, but counts rise rapidly when the rainy period ends.  The most important indoor molds are Aspergillus and Penicillium.  Dark, humid and poorly ventilated basements are ideal sites for mold growth.  The next most common sites of mold growth are the bathroom and the kitchen.  Outdoor (Seasonal) Mold Control  Positive outdoor molds via skin testing: Alternaria, Bipolaris (Helminthsporium), Drechslera (Curvalaria), and Mucor  Use air conditioning and keep windows closed Avoid exposure to decaying vegetation. Avoid leaf raking. Avoid grain handling. Consider wearing a face mask if working in moldy areas.    Indoor (Perennial) Mold Control   Positive indoor molds via skin testing: Aspergillus, Penicillium, Fusarium, Aureobasidium (Pullulara), and Rhizopus  Maintain humidity below 50%. Clean washable surfaces with 5% bleach solution. Remove sources e.g. contaminated carpets.    Control of Dust Mite Allergen    Dust mites play a major role in allergic asthma and rhinitis.  They occur in environments with high humidity wherever human skin is found.  Dust mites absorb humidity from the atmosphere (ie, they do not drink) and feed on organic matter (including shed human and animal skin).  Dust mites are a microscopic type of insect that you cannot  see with the naked eye.  High levels of dust mites have been detected from mattresses, pillows, carpets, upholstered furniture, bed covers, clothes, soft toys and any woven material.  The principal allergen of the dust mite  is found in its feces.  A gram of dust may contain 1,000 mites and 250,000 fecal particles.  Mite antigen is easily measured in the air during house cleaning activities.  Dust mites do not bite and do not cause harm to humans, other than by triggering allergies/asthma.    Ways to decrease your exposure to dust mites in your home:  Encase mattresses, box springs and pillows with a mite-impermeable barrier or cover   Wash sheets, blankets and drapes weekly in hot water (130 F) with detergent and dry them in a dryer on the hot setting.  Have the room cleaned frequently with a vacuum cleaner and a damp dust-mop.  For carpeting or rugs, vacuuming with a vacuum cleaner equipped with a high-efficiency particulate air (HEPA) filter.  The dust mite allergic individual should not be in a room which is being cleaned and should wait 1 hour after cleaning before going into the room. Do not sleep on upholstered furniture (eg, couches).   If possible removing carpeting, upholstered furniture and drapery from the home is ideal.  Horizontal blinds should be eliminated in the rooms where the person spends the most time (bedroom, study, television room).  Washable vinyl, roller-type shades are optimal. Remove all non-washable stuffed toys from the bedroom.  Wash stuffed toys weekly like sheets and blankets above.   Reduce indoor humidity to less than 50%.  Inexpensive humidity monitors can be purchased at most hardware stores.  Do not use a humidifier as can make the problem worse and are not recommended.  Allergy Shots   Allergies are the result of a chain reaction that starts in the immune system. Your immune system controls how your body defends itself. For instance, if you have an allergy to pollen, your immune system identifies pollen as an invader or allergen. Your immune system overreacts by producing antibodies called Immunoglobulin E (IgE). These antibodies travel to cells that release chemicals, causing an  allergic reaction.  The concept behind allergy immunotherapy, whether it is received in the form of shots or tablets, is that the immune system can be desensitized to specific allergens that trigger allergy symptoms. Although it requires time and patience, the payback can be long-term relief.  How Do Allergy Shots Work?  Allergy shots work much like a vaccine. Your body responds to injected amounts of a particular allergen given in increasing doses, eventually developing a resistance and tolerance to it. Allergy shots can lead to decreased, minimal or no allergy symptoms.  There generally are two phases: build-up and maintenance. Build-up often ranges from three to six months and involves receiving injections with increasing amounts of the allergens. The shots are typically given once or twice a week, though more rapid build-up schedules are sometimes used.  The maintenance phase begins when the most effective dose is reached. This dose is different for each person, depending on how allergic you are and your response to the build-up injections. Once the maintenance dose is reached, there are longer periods between injections, typically two to four weeks.  Occasionally doctors give cortisone-type shots that can temporarily reduce allergy symptoms. These types of shots are different and should not be confused with allergy immunotherapy shots.  Who Can Be Treated with Allergy Shots?  Allergy shots may be a  good treatment approach for people with allergic rhinitis (hay fever), allergic asthma, conjunctivitis (eye allergy) or stinging insect allergy.   Before deciding to begin allergy shots, you should consider:   The length of allergy season and the severity of your symptoms  Whether medications and/or changes to your environment can control your symptoms  Your desire to avoid long-term medication use  Time: allergy immunotherapy requires a major time commitment  Cost: may vary depending on your  insurance coverage  Allergy shots for children age 70 and older are effective and often well tolerated. They might prevent the onset of new allergen sensitivities or the progression to asthma.  Allergy shots are not started on patients who are pregnant but can be continued on patients who become pregnant while receiving them. In some patients with other medical conditions or who take certain common medications, allergy shots may be of risk. It is important to mention other medications you talk to your allergist.   When Will I Feel Better?  Some may experience decreased allergy symptoms during the build-up phase. For others, it may take as long as 12 months on the maintenance dose. If there is no improvement after a year of maintenance, your allergist will discuss other treatment options with you.  If you aren't responding to allergy shots, it may be because there is not enough dose of the allergen in your vaccine or there are missing allergens that were not identified during your allergy testing. Other reasons could be that there are high levels of the allergen in your environment or major exposure to non-allergic triggers like tobacco smoke.  What Is the Length of Treatment?  Once the maintenance dose is reached, allergy shots are generally continued for three to five years. The decision to stop should be discussed with your allergist at that time. Some people may experience a permanent reduction of allergy symptoms. Others may relapse and a longer course of allergy shots can be considered.  What Are the Possible Reactions?  The two types of adverse reactions that can occur with allergy shots are local and systemic. Common local reactions include very mild redness and swelling at the injection site, which can happen immediately or several hours after. A systemic reaction, which is less common, affects the entire body or a particular body system. They are usually mild and typically respond  quickly to medications. Signs include increased allergy symptoms such as sneezing, a stuffy nose or hives.  Rarely, a serious systemic reaction called anaphylaxis can develop. Symptoms include swelling in the throat, wheezing, a feeling of tightness in the chest, nausea or dizziness. Most serious systemic reactions develop within 30 minutes of allergy shots. This is why it is strongly recommended you wait in your doctor's office for 30 minutes after your injections. Your allergist is trained to watch for reactions, and his or her staff is trained and equipped with the proper medications to identify and treat them.  Who Should Administer Allergy Shots?  The preferred location for receiving shots is your prescribing allergist's office. Injections can sometimes be given at another facility where the physician and staff are trained to recognize and treat reactions, and have received instructions by your prescribing allergist.

## 2022-07-09 LAB — ALPHA-GAL PANEL
Allergen Lamb IgE: <0.1 kU/L
Beef IgE: <0.1 kU/L
IgE (Immunoglobulin E), Serum: 237 IU/mL (ref 6–495)
O215-IgE Alpha-Gal: <0.1 kU/L
Pork IgE: <0.1 kU/L

## 2022-07-13 LAB — CBC WITH DIFFERENTIAL
Basophils Absolute: 0 10*3/uL (ref 0.0–0.2)
Basos: 0 %
EOS (ABSOLUTE): 0.2 10*3/uL (ref 0.0–0.4)
Eos: 2 %
Hematocrit: 45.7 % (ref 34.0–46.6)
Hemoglobin: 14.9 g/dL (ref 11.1–15.9)
Immature Grans (Abs): 0 10*3/uL (ref 0.0–0.1)
Immature Granulocytes: 0 %
Lymphocytes Absolute: 2.2 10*3/uL (ref 0.7–3.1)
Lymphs: 27 %
MCH: 28.8 pg (ref 26.6–33.0)
MCHC: 32.6 g/dL (ref 31.5–35.7)
MCV: 88 fL (ref 79–97)
Monocytes Absolute: 0.7 10*3/uL (ref 0.1–0.9)
Monocytes: 8 %
Neutrophils Absolute: 5.1 10*3/uL (ref 1.4–7.0)
Neutrophils: 63 %
RBC: 5.18 x10E6/uL (ref 3.77–5.28)
RDW: 13.3 % (ref 11.7–15.4)
WBC: 8.3 10*3/uL (ref 3.4–10.8)

## 2022-07-13 LAB — CMP14+EGFR
ALT: 16 IU/L (ref 0–32)
AST: 9 IU/L (ref 0–40)
Albumin/Globulin Ratio: 1.5 (ref 1.2–2.2)
Albumin: 4.4 g/dL (ref 4.0–5.0)
Alkaline Phosphatase: 62 IU/L (ref 44–121)
BUN/Creatinine Ratio: 27 — ABNORMAL HIGH (ref 9–23)
BUN: 15 mg/dL (ref 6–20)
Bilirubin Total: 0.4 mg/dL (ref 0.0–1.2)
CO2: 23 mmol/L (ref 20–29)
Calcium: 9.6 mg/dL (ref 8.7–10.2)
Chloride: 101 mmol/L (ref 96–106)
Creatinine, Ser: 0.55 mg/dL — ABNORMAL LOW (ref 0.57–1.00)
Globulin, Total: 2.9 g/dL (ref 1.5–4.5)
Glucose: 83 mg/dL (ref 70–99)
Potassium: 4.5 mmol/L (ref 3.5–5.2)
Sodium: 140 mmol/L (ref 134–144)
Total Protein: 7.3 g/dL (ref 6.0–8.5)
eGFR: 133 mL/min/{1.73_m2} (ref >59)

## 2022-07-13 LAB — THYROID ANTIBODIES
Thyroglobulin Antibody: 26.1 IU/mL — ABNORMAL HIGH (ref 0.0–0.9)
Thyroperoxidase Ab SerPl-aCnc: 138 IU/mL — ABNORMAL HIGH (ref 0–34)

## 2022-07-13 LAB — ANTINUCLEAR ANTIBODIES, IFA: ANA Titer 1: NEGATIVE

## 2022-07-13 LAB — SEDIMENTATION RATE: Sed Rate: 8 mm/hr (ref 0–32)

## 2022-07-13 LAB — C-REACTIVE PROTEIN: CRP: 11 mg/L — ABNORMAL HIGH (ref 0–10)

## 2022-07-13 LAB — CHRONIC URTICARIA: cu index: <1.9 (ref <10)

## 2022-07-13 LAB — TRYPTASE: Tryptase: 6.3 ug/L (ref 2.2–13.2)

## 2022-07-24 ENCOUNTER — Encounter: Payer: Self-pay | Admitting: *Deleted

## 2022-07-25 ENCOUNTER — Encounter: Payer: Self-pay | Admitting: Internal Medicine

## 2022-07-25 ENCOUNTER — Ambulatory Visit: Payer: Medicaid Other | Attending: Internal Medicine | Admitting: Internal Medicine

## 2022-07-25 VITALS — BP 110/73 | HR 78 | Ht 65.0 in | Wt 221.6 lb

## 2022-07-25 DIAGNOSIS — R55 Syncope and collapse: Secondary | ICD-10-CM

## 2022-07-25 NOTE — Progress Notes (Signed)
Cardiology Office Note  Date: 07/25/2022   ID: Barbara Vazquez, DOB 2001-03-24, MRN [id number]  PCP:  Raiford Simmonds., PA-C  Cardiologist:  Chalmers Guest, MD Electrophysiologist:  None   Reason for Office Visit: Syncope at the request of Muse, PA-C   History of Present Illness: Barbara Vazquez is a 22 y.o. female known to have mild intermittent asthma, seasonal allergies, allergic urticaria with angioedema was referred to cardiology clinic for evaluation of syncope.  Patient had intermittent urticaria for the last several months but had ER visit on 06/08/2022, 06/14/2022 and 06/28/2022 for evaluation of urticaria and syncope. She had urticaria and syncope on 06/08/2022 for which she was given prednisone tapering regimen and discharged. She returned back on 06/14/2022 (the document is not downloaded) with similar symptoms of urticaria but new swelling of lips and throat and syncope for which she was discharged on prednisone (per patient and I am not able to access the document). She returned back on 06/28/2022 with no urticaria but 2 syncopal episodes and she reported that she completed prednisone therapy 1-2 days prior to presentation.  She was again discharged on prednisone regimen. She visited her allergist on 07/06/2022 who discontinued prednisone and started new medications for allergy. EKG from 06/28/2022 showed normal sinus rhythm and no ST-T changes, normal QTc interval. She denied having any syncopal episodes prior to 06/2022 and also after 07/06/2022. Otherwise denies any symptoms of chest pain, SOB, dizziness, palpitations or leg swelling.  Past Medical History:  Diagnosis Date   Asthma    Eczema    Gonorrhea    Mental disorder    anxiety   Teenage parent    March 2020    Past Surgical History:  Procedure Laterality Date   NO PAST SURGERIES      Current Outpatient Medications  Medication Sig Dispense Refill   azelastine (ASTELIN) 0.1 % nasal spray Place 2 sprays  per nostril 1-2 times daily as needed 30 mL 5   diphenhydrAMINE (BENADRYL) 25 mg capsule Take 25 mg by mouth every 6 (six) hours as needed.     EPINEPHrine 0.3 mg/0.3 mL IJ SOAJ injection Inject 0.3 mg into the muscle as needed.     fluticasone (FLONASE) 50 MCG/ACT nasal spray Place 2 sprays into both nostrils daily.     hydrocortisone 2.5 % cream Apply topically 2 (two) times daily. (Patient taking differently: Apply 1 Application topically 2 (two) times daily as needed.) 30 g 2   No current facility-administered medications for this visit.   Allergies:  Penicillins and Amoxicillin   Social History: The patient  reports that she has quit smoking. Her smoking use included e-cigarettes. She has never used smokeless tobacco. She reports that she does not drink alcohol and does not use drugs.   Family History: The patient's family history includes ADD / ADHD in her brother; Asthma in her maternal grandmother and mother; Cancer in her paternal grandmother; Eczema in her mother; Lung disease in her father, maternal grandfather, maternal grandmother, and paternal grandfather; Mental illness in her father and mother.   ROS:  Please see the history of present illness. Otherwise, complete review of systems is positive for none.  All other systems are reviewed and negative.   Physical Exam: VS:  BP 110/73 (BP Location: Right Arm, Cuff Size: Normal)   Pulse 78   Ht 5\' 5"  (1.651 m)   Wt 221 lb 9.6 oz (100.5 kg)   SpO2 100%   BMI 36.88 kg/m , BMI  Body mass index is 36.88 kg/m.  Wt Readings from Last 3 Encounters:  07/25/22 221 lb 9.6 oz (100.5 kg)  07/06/22 215 lb 2 oz (97.6 kg)  05/11/20 228 lb (103.4 kg)    General: Patient appears comfortable at rest. HEENT: Conjunctiva and lids normal, oropharynx clear with moist mucosa. Neck: Supple, no elevated JVP or carotid bruits, no thyromegaly. Lungs: Clear to auscultation, nonlabored breathing at rest. Cardiac: Regular rate and rhythm, no S3 or  significant systolic murmur, no pericardial rub. Abdomen: Soft, nontender, no hepatomegaly, bowel sounds present, no guarding or rebound. Extremities: No pitting edema, distal pulses 2+. Skin: Warm and dry. Musculoskeletal: No kyphosis. Neuropsychiatric: Alert and oriented x3, affect grossly appropriate.  ECG:  An ECG dated 07/25/2022 was personally reviewed today and demonstrated:  Normal sinus rhythm and no ST changes  Recent Labwork: 07/06/2022: ALT 16; AST 9; BUN 15; Creatinine, Ser 0.55; Hemoglobin 14.9; Potassium 4.5; Sodium 140  No results found for: "CHOL", "TRIG", "HDL", "CHOLHDL", "VLDL", "LDLCALC", "LDLDIRECT"  Other Studies Reviewed Today: Personally reviewed the ER notes from [facility]  Assessment and Plan: Patient is a 22 year old F known to have mild intermittent asthma, seasonal allergies, allergic urticaria with angioedema was referred to cardiology clinic for evaluation of syncope.  # Syncope likely secondary to ?cholinergic urticaria or/and angioedema -Orthostatic vitals in the clinic today showed no evidence of orthostatic hypotension and POTS. Syncope also occurred in the context of urticaria and angioedema. No syncopal episodes prior to 06/2022 and after allergist visit 07/06/2022. EKG showed normal sinus rhythm and no ST-T changes. Normal QTc interval. No further cardiac testing is indicated at this time.  I have spent a total of 30 minutes with patient reviewing chart, EKGs, labs and examining patient as well as establishing an assessment and plan that was discussed with the patient.  > 50% of time was spent in direct patient care.     Medication Adjustments/Labs and Tests Ordered: Current medicines are reviewed at length with the patient today.  Concerns regarding medicines are outlined above.   Tests Ordered: No orders of the defined types were placed in this encounter.   Medication Changes: No orders of the defined types were placed in this  encounter.   Disposition:  Follow up prn  Signed Naelle Diegel Fidel Levy, MD, 07/25/2022 10:32 AM    [facility] [place], [place], [place] [phone]

## 2022-07-25 NOTE — Patient Instructions (Addendum)
Medication Instructions:   Your physician recommends that you continue on your current medications as directed. Please refer to the Current Medication list given to you today.  Labwork:  none  Testing/Procedures:  none  Follow-Up:  Your physician recommends that you schedule a follow-up appointment in: as needed.  Any Other Special Instructions Will Be Listed Below (If Applicable).  If you need a refill on your cardiac medications before your next appointment, please call your pharmacy. 

## 2022-08-17 ENCOUNTER — Ambulatory Visit (INDEPENDENT_AMBULATORY_CARE_PROVIDER_SITE_OTHER): Payer: Medicaid Other | Admitting: Allergy & Immunology

## 2022-08-17 ENCOUNTER — Encounter: Payer: Self-pay | Admitting: Allergy & Immunology

## 2022-08-17 ENCOUNTER — Other Ambulatory Visit: Payer: Self-pay

## 2022-08-17 VITALS — BP 118/70 | HR 80 | Temp 98.4°F | Resp 20 | Ht 64.0 in | Wt 219.0 lb

## 2022-08-17 DIAGNOSIS — L500 Allergic urticaria: Secondary | ICD-10-CM

## 2022-08-17 DIAGNOSIS — J4520 Mild intermittent asthma, uncomplicated: Secondary | ICD-10-CM | POA: Diagnosis not present

## 2022-08-17 DIAGNOSIS — T783XXD Angioneurotic edema, subsequent encounter: Secondary | ICD-10-CM

## 2022-08-17 DIAGNOSIS — J3089 Other allergic rhinitis: Secondary | ICD-10-CM | POA: Diagnosis not present

## 2022-08-17 DIAGNOSIS — J302 Other seasonal allergic rhinitis: Secondary | ICD-10-CM

## 2022-08-17 MED ORDER — VITAMIN D (ERGOCALCIFEROL) 1.25 MG (50000 UNIT) PO CAPS: 50000.0000 [IU] | ORAL_CAPSULE | ORAL | 0 refills | Status: DC

## 2022-08-17 NOTE — Progress Notes (Signed)
FOLLOW UP  Date of Service/Encounter:  08/17/22   Assessment:   Mild intermittent asthma, uncomplicated    Seasonal and perennial allergic rhinitis (grasses, ragweed, weeds, trees, indoor molds, outdoor molds, and dust mites)   Allergic urticaria with angioedema - getting autoimmune labs due to family history of autoimmunity   Plan/Recommendations:   1. Seasonal and perennial allergic rhinitis - Previous testing showed: grasses, ragweed, weeds, trees, indoor molds, outdoor molds, and dust mites - Continue with: Allegra in the morning and Zyrtec at night - Continue with: Flonase (fluticasone) one spray per nostril daily (AIM FOR EAR ON EACH SIDE) and Astelin (azelastine) 2 sprays per nostril 1-2 times daily - You can use an extra dose of the antihistamine, if needed, for breakthrough symptoms.  - Consider nasal saline rinses 1-2 times daily to remove allergens from the nasal cavities as well as help with mucous clearance (this is especially helpful to do before the nasal sprays are given) - We can hold off on allergy shots as a means of long-term control. - Allergy shots "re-train" and "reset" the immune system to ignore environmental allergens and decrease the resulting immune response to those allergens (sneezing, itchy watery eyes, runny nose, nasal congestion, etc).     2. Allergic urticaria and angioedema - I think that this is likely related to the environment and the thyroid antibodies.  - In the meantime, continue with suppressive dosing of antihistamines:   - Morning: Allegra (fexofenadine) 180 mg + Pepcid (famotidine) 34m  - Evening: Zyrtec (cetirizine) 10 mg +Pepcid (famotidine) 239m- You can change this dosing at home, decreasing the dose as needed or increasing the dosing as needed.  - If you are not tolerating the medications or are tired of taking them every day, we can start treatment with a monthly injectable medication called Xolair.   3. Mild intermittent  asthma, uncomplicated - Lung testing not done today.  - I do not think that we need a controller medication at this time. - Continue with albuterol as needed.   4. Return in about 6 months (around 02/15/2023).   Subjective:   Barbara Perovichs a [age].o. female presenting today for follow up of  Chief Complaint  Patient presents with   Follow-up    Pt states she is doing good not having any rash .. 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  PCP: Barbara Vazquez    Barbara Vazquez a 89.o. female presenting for a follow up visit.  We last saw her in January 2024.  At that time, she had testing that was positive to multiple indoor and outdoor allergens.  We continue with alternating antihistamines and started Flonase and Astelin.  For her hives and swelling, we obtained a lot of labs.  We started Allegra and Pepcid in the morning and Zyrtec and Pepcid at night.  Her lung testing looked great.  We continued with albuterol as needed.  Her labs were notable for elevated antithyroid antibodies. Everything else was essentially normal. She made a self referral to  see an endocrinologist. Evidently they think that she has Hashimoto's thyroiditis. They are planning on   She is on a strict diet and she goes to the gym a lot. She is able to keep all of her levels on a "safe point". She was told that she is going to have some problems as she gets older. They are going to have to start a thyroid medication as she gets older. But for now, they are just  watching her levels to see how she does.  1.  He did make a  She thinks that her passing out in the bathroom was more related to the prednisone wean that she was on. They do not think that this was related to any cardiac issues at all.   Allergic Rhinitis Symptom History: Allergic rhinitis are well controlled with the medications. She has been doing well with the medications. She can tell when she forgets any of the nose sprays or the pills. She is currently one Allegra in the morning and then one Zyrtec at night. She is also doing one nose spray in the morning and one nose spray at night.   She got an air purifier and dust mite covers. All of these changes have been helpful. They have been vacuuming the carpet much more than she was previously.   She has has had some intermittent breakthrough urticaria when she is doing some cleaning at work. She is working at [employer]. 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   Skin Symptom History: She has had no outbreaks at all since we saw her last time.  She is doing very well with regards to her skin.   She had a vitamin D deficiency. She was on 50K units weekly. She requested a refill. I sent in 5 more pills. She is going to have her vitamin D level redrawn in the near future.   Otherwise, there have been no changes to her past medical history, surgical history, family history, or social history.    Review of Systems  Constitutional: Negative.  Negative for chills, fever, malaise/fatigue and weight loss.  HENT: Negative.  Negative for congestion, ear discharge and ear pain.   Eyes:  Negative for pain, discharge and redness.  Respiratory:  Negative for cough, sputum production, shortness of breath and wheezing.   Cardiovascular: Negative.  Negative for chest pain and palpitations.  Gastrointestinal:  Negative for abdominal pain,  constipation, diarrhea, heartburn, nausea and vomiting.  Skin:  Positive for itching and rash.  Neurological:  Negative for dizziness and headaches.  Endo/Heme/Allergies:  Positive for environmental allergies. Does not bruise/bleed easily.       Objective:   Blood pressure 118/70, pulse 80, temperature 98.4 F (36.9 C), resp. rate 20, height 5' 4"$  (1.626 m), weight 219 lb (99.3 kg), SpO2 98 %. Body mass index is 37.59 kg/m.    Physical Exam Vitals reviewed.  Constitutional:      Appearance: She is well-developed.  HENT:     Head: Normocephalic and atraumatic.     Right Ear: Tympanic membrane, ear canal and external ear normal. No drainage, swelling or tenderness. Tympanic membrane is not injected, scarred, erythematous, retracted or bulging.     Left Ear: Tympanic membrane, ear canal and external ear normal. No drainage, swelling or tenderness. Tympanic membrane is not injected, scarred, erythematous, retracted or bulging.     Nose:  No nasal deformity, septal deviation, mucosal edema or rhinorrhea.     Right Turbinates: Enlarged, swollen and pale.     Left Turbinates: Enlarged, swollen and pale.     Right Sinus: No maxillary sinus tenderness or frontal sinus tenderness.     Left Sinus: No maxillary sinus tenderness or frontal sinus tenderness.     Comments: No nasal polyps noted.     Mouth/Throat:     Mouth: Mucous membranes are not pale and not dry.     Pharynx: Uvula midline.  Eyes:     General:        Right eye: No discharge.        Left eye: No discharge.     Conjunctiva/sclera: Conjunctivae normal.     Right eye: Right conjunctiva is not injected. No chemosis.    Left eye: Left conjunctiva is not injected. No chemosis.    Pupils: Pupils are equal, round, and reactive to light.  Cardiovascular:     Rate and Rhythm: Normal rate and regular rhythm.     Heart sounds: Normal heart sounds.  Pulmonary:     Effort: Pulmonary effort is normal. No tachypnea, accessory muscle  usage or respiratory distress.     Breath sounds: Normal breath sounds. No wheezing, rhonchi or rales.     Comments: Moving air well in all lung fields. No increased work of breathing noted.  Chest:     Chest wall: No tenderness.  Abdominal:     Tenderness: There is no abdominal tenderness. There is no guarding or rebound.  Lymphadenopathy:     Head:     Right side of head: No submandibular, tonsillar or occipital adenopathy.     Left side of head: No submandibular, tonsillar or occipital adenopathy.     Cervical: No cervical adenopathy.  Skin:    General: Skin is warm.     Capillary Refill: Capillary refill takes less than 2 seconds.     Coloration: Skin is not pale.     Findings: No abrasion, erythema, petechiae or rash. Rash is not papular, urticarial or vesicular.     Comments: Excoriations present over the entire body. There are some isolated urticaria present over the entire body, sparing the back.   Neurological:     Mental Status: She is alert.  Psychiatric:        Behavior: Behavior is cooperative.      Diagnostic studies: none       Salvatore Marvel, MD  [facility] [place]

## 2022-08-17 NOTE — Patient Instructions (Addendum)
1. Seasonal and perennial allergic rhinitis - Previous testing showed: grasses, ragweed, weeds, trees, indoor molds, outdoor molds, and dust mites - Continue with: Allegra in the morning and Zyrtec at night - Continue with: Flonase (fluticasone) one spray per nostril daily (AIM FOR EAR ON EACH SIDE) and Astelin (azelastine) 2 sprays per nostril 1-2 times daily - You can use an extra dose of the antihistamine, if needed, for breakthrough symptoms.  - Consider nasal saline rinses 1-2 times daily to remove allergens from the nasal cavities as well as help with mucous clearance (this is especially helpful to do before the nasal sprays are given) - We can hold off on allergy shots as a means of long-term control. - Allergy shots "re-train" and "reset" the immune system to ignore environmental allergens and decrease the resulting immune response to those allergens (sneezing, itchy watery eyes, runny nose, nasal congestion, etc).     2. Allergic urticaria and angioedema - I think that this is likely related to the environment and the thyroid antibodies.  - In the meantime, continue with suppressive dosing of antihistamines:   - Morning: Allegra (fexofenadine) 180 mg + Pepcid (famotidine) 104m  - Evening: Zyrtec (cetirizine) 10 mg +Pepcid (famotidine) 270m- You can change this dosing at home, decreasing the dose as needed or increasing the dosing as needed.  - If you are not tolerating the medications or are tired of taking them every day, we can start treatment with a monthly injectable medication called Xolair.   3. Mild intermittent asthma, uncomplicated - Lung testing not done today.  - I do not think that we need a controller medication at this time. - Continue with albuterol as needed.   4. Return in about 6 months (around 02/15/2023).    Please inform usKoreaf any Emergency Department visits, hospitalizations, or changes in symptoms. Call usKoreaefore going to the ED for breathing or allergy  symptoms since we might be able to fit you in for a sick visit. Feel free to contact usKoreanytime with any questions, problems, or concerns.  It was a pleasure to see you again today!  Websites that have reliable patient information: 1. American Academy of Asthma, Allergy, and Immunology: www.aaaai.org 2. Food Allergy Research and Education (FARE): foodallergy.org 3. Mothers of Asthmatics: http://www.asthmacommunitynetwork.org 4. American College of Allergy, Asthma, and Immunology: www.acaai.org   COVID-19 Vaccine Information can be found at: [email or web address] questions related to vaccine distribution or appointments, please email [email or web address] or call [phone]  We realize that you might be concerned about having an allergic reaction to the COVID19 vaccines. To help with that concern, WE ARE OFFERING THE COVID19 VACCINES IN OUR OFFICE! Ask the front desk for dates!     "Like" us[facility] Facebook and Instagram for our latest updates!      A healthy democracy works best when ALNew York Life Insurancearticipate! Make sure you are registered to vote! If you have moved or changed any of your contact information, you will need to get this updated before voting!  In some cases, you MAY be able to register to vote online: [email or web address]

## 2022-08-20 ENCOUNTER — Encounter: Payer: Self-pay | Admitting: Allergy & Immunology

## 2022-10-29 ENCOUNTER — Encounter (HOSPITAL_COMMUNITY): Payer: Self-pay

## 2022-10-29 ENCOUNTER — Other Ambulatory Visit: Payer: Self-pay

## 2022-10-29 ENCOUNTER — Emergency Department (HOSPITAL_COMMUNITY)
Admission: EM | Admit: 2022-10-29 | Discharge: 2022-10-29 | Disposition: A | Discharge status: Home / Self Care | Payer: Medicaid Other | Attending: Student | Admitting: Student

## 2022-10-29 DIAGNOSIS — R591 Generalized enlarged lymph nodes: Secondary | ICD-10-CM | POA: Diagnosis not present

## 2022-10-29 DIAGNOSIS — R21 Rash and other nonspecific skin eruption: Secondary | ICD-10-CM | POA: Insufficient documentation

## 2022-10-29 LAB — CBC WITH DIFFERENTIAL/PLATELET
Abs Immature Granulocytes: 0.01 10*3/uL (ref 0.00–0.07)
Basophils Absolute: 0 10*3/uL (ref 0.0–0.1)
Basophils Relative: 0 %
Eosinophils Absolute: 0.1 10*3/uL (ref 0.0–0.5)
Eosinophils Relative: 2 %
HCT: 42.2 % (ref 36.0–46.0)
Hemoglobin: 13.4 g/dL (ref 12.0–15.0)
Immature Granulocytes: 0 %
Lymphocytes Relative: 27 %
Lymphs Abs: 1.5 10*3/uL (ref 0.7–4.0)
MCH: 28.6 pg (ref 26.0–34.0)
MCHC: 31.8 g/dL (ref 30.0–36.0)
MCV: 90 fL (ref 80.0–100.0)
Monocytes Absolute: 0.6 10*3/uL (ref 0.1–1.0)
Monocytes Relative: 12 %
Neutro Abs: 3.1 10*3/uL (ref 1.7–7.7)
Neutrophils Relative %: 59 %
Platelets: 331 10*3/uL (ref 150–400)
RBC: 4.69 MIL/uL (ref 3.87–5.11)
RDW: 13.5 % (ref 11.5–15.5)
WBC: 5.3 10*3/uL (ref 4.0–10.5)
nRBC: 0 % (ref 0.0–0.2)

## 2022-10-29 LAB — BASIC METABOLIC PANEL
Anion gap: 10 (ref 5–15)
BUN: 12 mg/dL (ref 6–20)
CO2: 27 mmol/L (ref 22–32)
Calcium: 9.4 mg/dL (ref 8.9–10.3)
Chloride: 101 mmol/L (ref 98–111)
Creatinine, Ser: 0.58 mg/dL (ref 0.44–1.00)
GFR, Estimated: >60 mL/min (ref >60)
Glucose, Bld: 96 mg/dL (ref 70–99)
Potassium: 4 mmol/L (ref 3.5–5.1)
Sodium: 138 mmol/L (ref 135–145)

## 2022-10-29 LAB — MONONUCLEOSIS SCREEN: Mono Screen: NEGATIVE

## 2022-10-29 MED ORDER — MELOXICAM 7.5 MG PO TABS: 7.5000 mg | ORAL_TABLET | Freq: Every day | ORAL | 0 refills | Status: DC

## 2022-10-29 MED ORDER — ONDANSETRON 4 MG PO TBDP: 4.0000 mg | ORAL_TABLET | Freq: Once | ORAL | Status: DC

## 2022-10-29 NOTE — ED Triage Notes (Signed)
Pt arrived POV from home c/o being diagnosed with hypothyroidism and was started on levothyroxine and started experiencing pain in her hands, feet and knees, sher her PCP told her to stop taking it, but now she has hives and itches all over and the pain is worse.

## 2022-10-29 NOTE — ED Provider Notes (Addendum)
[place] EMERGENCY DEPARTMENT AT [facility] Provider Note   CSN: [id number] Arrival date & time: 10/29/22  2841     History  Chief Complaint  Patient presents with   Allergic Reaction    Barbara Vazquez is a 22 y.o. female.  Patient with history of thyroid antibodies, normal thyroid panel in 2024-01-17follows with allergist in [place] --presents to the emergency department for evaluation of hives and also some hard knots that appear under her skin on her hands and feet.  This has been a chronic problem for the patient.  Currently the allergist has her on antihistamines and Pepcid in the morning and in the evening.  This is to help suppress the chronic urticaria thought to be due to thyroid antibodies.  She states that she was trialed on levothyroxine however she could not tolerate this.  She has been on several courses of prednisone initially when this was thought to be an allergic reaction, which she also cannot tolerate.  She has not seen an endocrinologist.  She also reports having a left-sided tonsillar lymph node which has been present for about 3 weeks.  No associated sore throat, fevers.       Home Medications Prior to Admission medications   Medication Sig Start Date End Date Taking? Authorizing Provider  azelastine (ASTELIN) 0.1 % nasal spray Place 2 sprays per nostril 1-2 times daily as needed 07/06/22   Alfonse Spruce, MD  busPIRone (BUSPAR) 5 MG tablet Take by mouth. 07/26/22   [provider]  cetirizine (ZYRTEC) 10 MG tablet Take 1 tablet by mouth daily.    [provider]  diphenhydrAMINE (BENADRYL) 25 mg capsule Take 25 mg by mouth every 6 (six) hours as needed. 06/08/22   [provider]  EPINEPHrine 0.3 mg/0.3 mL IJ SOAJ injection Inject 0.3 mg into the muscle as needed.    [provider]  fexofenadine (ALLEGRA) 180 MG tablet Take by mouth.    [provider]  fluticasone (FLONASE) 50 MCG/ACT nasal  spray Place 2 sprays into both nostrils daily. 03/26/22   [provider]  Vitamin D, Ergocalciferol, (DRISDOL) 1.25 MG (50000 UNIT) CAPS capsule Take 1 capsule (50,000 Units total) by mouth once a week. 08/17/22   Alfonse Spruce, MD      Allergies    Penicillins and Amoxicillin    Review of Systems   Review of Systems  Physical Exam Updated Vital Signs BP 121/71   Pulse 77   Temp 98.2 F (36.8 C) (Oral)   Resp 16   Ht 5\' 4"  (1.626 m)   Wt 97.5 kg   SpO2 99%   BMI 36.90 kg/m   Physical Exam Vitals and nursing note reviewed.  Constitutional:      General: She is not in acute distress.    Appearance: She is well-developed.  HENT:     Head: Normocephalic and atraumatic.     Right Ear: External ear normal.     Left Ear: External ear normal.     Nose: Nose normal.  Eyes:     Conjunctiva/sclera: Conjunctivae normal.  Cardiovascular:     Rate and Rhythm: Normal rate and regular rhythm.     Heart sounds: No murmur heard. Pulmonary:     Effort: No respiratory distress.     Breath sounds: No wheezing, rhonchi or rales.  Abdominal:     Palpations: Abdomen is soft.     Tenderness: There is no abdominal tenderness. There is no  guarding or rebound.  Musculoskeletal:     Cervical back: Normal range of motion and neck supple.     Right lower leg: No edema.     Left lower leg: No edema.  Lymphadenopathy:     Head:     Right side of head: No tonsillar adenopathy.     Left side of head: Tonsillar adenopathy present.     Cervical: No cervical adenopathy.  Skin:    General: Skin is warm and dry.     Findings: No rash.     Comments: No urticaria currently.   Neurological:     General: No focal deficit present.     Mental Status: She is alert. Mental status is at baseline.     Motor: No weakness.  Psychiatric:        Mood and Affect: Mood normal.     ED Results / Procedures / Treatments   Labs (all labs ordered are listed, but only abnormal results are  displayed) Labs Reviewed - No data to display  EKG None  Radiology No results found.  Procedures Procedures    Medications Ordered in ED Medications - No data to display  ED Course/ Medical Decision Making/ A&P    Patient seen and examined. History obtained directly from patient.  I reviewed outpatient endocrinology notes and lab workup done over the past 6 months.  Given lymphadenopathy for several weeks, will check CBC, BMP, Monospot.  Imaging: None ordered  Medications/Fluids: None ordered  Most recent vital signs reviewed and are as follows: BP 121/71   Pulse 77   Temp 98.2 F (36.8 C) (Oral)   Resp 16   Ht 5\' 4"  (1.626 m)   Wt 97.5 kg   SpO2 99%   BMI 36.90 kg/m   Initial impression: Patient with several different complaints today, unclear what exactly is related to her thyroid disorder.  She does not have any signs of severe myxedema.  No constipation, hypothermia, confusion, or bradycardia.  Unfortunately she has several medication intolerances.  Will consider anti-inflammatories for pain.  I agree with regimen prescribed by allergist.  I recommended that she follow-up with a endocrinologist for further evaluation of her thyroid control if she cannot tolerate the levothyroxine.  1:10 PM Reassessment performed. Patient appears stable.  Labs personally reviewed and interpreted including: CBC with normal white blood cell counts; BMP unremarkable; Monospot negative.  Reviewed pertinent lab work and imaging with patient at bedside. Questions answered.   Most current vital signs reviewed and are as follows: BP 121/71   Pulse 77   Temp 98.2 F (36.8 C) (Oral)   Resp 16   Ht 5\' 4"  (1.626 m)   Wt 97.5 kg   SpO2 99%   BMI 36.90 kg/m   Plan: Discharge to home.   She will need additional outpatient follow-up.  Unfortunately she has not gotten a lot of answers in regards to her condition and this is very understandable.  Discussed that she may benefit from getting  help from a dermatologist or endocrinologist.  We also discussed the swollen node in her neck.  Discussed that this will need to be reevaluated in about a week and may need a biopsy in the future if this is persistent.  Prescriptions written for: Meloxicam  Other home care instructions discussed: Continued antihistamine blockade  ED return instructions discussed: New or worsening symptoms  Follow-up instructions discussed: Patient encouraged to follow-up with their PCP in 1 week.  Medical Decision Making Amount and/or Complexity of Data Reviewed Labs: ordered.  Risk Prescription drug management.   Patient here with several problems today.  She does have a swollen lymph node in the left neck that has been present for about 3 weeks.  CBC is unremarkable.  She will need additional follow-up and possible consideration of biopsy if it persists longer.  She also has had ongoing skin problems thought to be at least in part related to Hashimoto's.  She was found to have antibodies to thyroid.  I do not suspect that she has myxedema today.  Will continue treatment for rash and I did add on meloxicam for inflammation and pain.  Unfortunately there is not much more I can do from the ED setting.  She will need to follow-up with her PCP and may be get an opinion from a endocrinologist or dermatologist in the future.   The patient's vital signs, pertinent lab work and imaging were reviewed and interpreted as discussed in the ED course. Hospitalization was considered for further testing, treatments, or serial exams/observation. However as patient is well-appearing, has a stable exam, and reassuring studies today, I do not feel that they warrant admission at this time. This plan was discussed with the patient who verbalizes agreement and comfort with this plan and seems reliable and able to return to the Emergency Department with worsening or changing symptoms.        Final Clinical Impression(s) / ED Diagnoses Final diagnoses:  Lymphadenopathy  Skin eruption    Rx / DC Orders ED Discharge Orders          Ordered    meloxicam (MOBIC) 7.5 MG tablet  Daily        10/29/22 1306              Renne Crigler, PA-C 10/29/22 1314    Kommor, Letha, MD 10/29/22 2027

## 2022-10-29 NOTE — Discharge Instructions (Addendum)
Your blood cell counts and electrolytes today were normal as well as a test for mononucleosis which can cause swollen lymph nodes.  For the swollen lymph node, you should have this checked on by your doctor in about a week.  If this is not getting better, you may need a biopsy to help determine why this lymph node is swollen.  In addition, I have prescribed an anti-inflammatory to help with the pain from the areas on the skin.  You may benefit from seeing an endocrinologist or a dermatologist to help figure out the underlying cause of this problem and how to control it.  Again this is not something I can provide for you today from the emergency department.  Your primary care doctor may be able to help facilitate this as well.  I agree with continuing the antihistamines and famotidine recommended by the allergist.

## 2022-11-19 ENCOUNTER — Other Ambulatory Visit: Payer: Self-pay | Admitting: Allergy & Immunology

## 2023-02-15 ENCOUNTER — Ambulatory Visit: Payer: Medicaid Other | Admitting: Allergy & Immunology

## 2023-06-12 ENCOUNTER — Ambulatory Visit: Payer: Self-pay | Admitting: Family Medicine

## 2023-06-12 ENCOUNTER — Encounter: Payer: Self-pay | Admitting: Family Medicine

## 2023-06-12 ENCOUNTER — Other Ambulatory Visit: Payer: Self-pay

## 2023-06-12 VITALS — BP 122/84 | HR 92 | Temp 98.0°F | Resp 18 | Ht 64.0 in | Wt 215.5 lb

## 2023-06-12 DIAGNOSIS — J302 Other seasonal allergic rhinitis: Secondary | ICD-10-CM

## 2023-06-12 DIAGNOSIS — L500 Allergic urticaria: Secondary | ICD-10-CM

## 2023-06-12 DIAGNOSIS — J3089 Other allergic rhinitis: Secondary | ICD-10-CM

## 2023-06-12 DIAGNOSIS — T783XXD Angioneurotic edema, subsequent encounter: Secondary | ICD-10-CM

## 2023-06-12 DIAGNOSIS — J4520 Mild intermittent asthma, uncomplicated: Secondary | ICD-10-CM

## 2023-06-12 NOTE — Patient Instructions (Addendum)
Asthma Continue albuterol 2 puffs once every 4 hours if needed for cough or wheeze You may use albuterol 2 puffs 5 to 15 minutes before activity to decrease cough or wheeze  Allergic rhinitis Continue allergen avoidance measures directed toward grass pollen, weed pollen, ragweed pollen, tree pollen, indoor mold, outdoor mold, and dust mite as listed below Continue Allegra in the morning and cetirizine in the evening Continue Flonase 2 sprays in each nostril once a day as needed for stuffy nose. In the right nostril, point the applicator out toward the right ear. In the left nostril, point the applicator out toward the left ear Consider saline nasal rinses as needed for nasal symptoms. Use this before any medicated nasal sprays for best result  Urticaria/angioedema If needed, begin prednisone 10 mg tablets. Take 2 tablets twice a day for 3 days, then take 2 tablets once a day for 1 day, then take 1 tablet on the 5th day, then stop Continue Allegra (2 tablets) plus famotidine in the morning.  Continue cetirizine (2 tablets) plus famotidine in the evening. Continue montelukast 10 mg once a day If your symptoms re-occur, begin a journal of events that occurred for up to 6 hours before your symptoms began including foods and beverages consumed, soaps or perfumes you had contact with, and medications.  Let's begin Xolair injections for hive control.  Follow up with your primary care provider for thyroid management  Call the clinic if this treatment plan is not working well for you.    Follow up in 2 months or sooner if needed.  Reducing Pollen Exposure The American Academy of Allergy, Asthma and Immunology suggests the following steps to reduce your exposure to pollen during allergy seasons. Do not hang sheets or clothing out to dry; pollen may collect on these items. Do not mow lawns or spend time around freshly cut grass; mowing stirs up pollen. Keep windows closed at night.  Keep car windows  closed while driving. Minimize morning activities outdoors, a time when pollen counts are usually at their highest. Stay indoors as much as possible when pollen counts or humidity is high and on windy days when pollen tends to remain in the air longer. Use air conditioning when possible.  Many air conditioners have filters that trap the pollen spores. Use a HEPA room air filter to remove pollen form the indoor air you breathe.  Control of Mold Allergen Mold and fungi can grow on a variety of surfaces provided certain temperature and moisture conditions exist.  Outdoor molds grow on plants, decaying vegetation and soil.  The major outdoor mold, Alternaria and Cladosporium, are found in very high numbers during hot and dry conditions.  Generally, a late Summer - Fall peak is seen for common outdoor fungal spores.  Rain will temporarily lower outdoor mold spore count, but counts rise rapidly when the rainy period ends.  The most important indoor molds are Aspergillus and Penicillium.  Dark, humid and poorly ventilated basements are ideal sites for mold growth.  The next most common sites of mold growth are the bathroom and the kitchen.  Outdoor [employer] Use air conditioning and keep windows closed Avoid exposure to decaying vegetation. Avoid leaf raking. Avoid grain handling. Consider wearing a face mask if working in moldy areas.  Indoor Mold Control Maintain humidity below 50%. Clean washable surfaces with 5% bleach solution. Remove sources e.g. Contaminated carpets.   Control of Dust Mite Allergen Dust mites play a major role in allergic asthma and  rhinitis. They occur in environments with high humidity wherever human skin is found. Dust mites absorb humidity from the atmosphere (ie, they do not drink) and feed on organic matter (including shed human and animal skin). Dust mites are a microscopic type of insect that you cannot see with the naked eye. High levels of dust mites have been  detected from mattresses, pillows, carpets, upholstered furniture, bed covers, clothes, soft toys and any woven material. The principal allergen of the dust mite is found in its feces. A gram of dust may contain 1,000 mites and 250,000 fecal particles. Mite antigen is easily measured in the air during house cleaning activities. Dust mites do not bite and do not cause harm to humans, other than by triggering allergies/asthma.  Ways to decrease your exposure to dust mites in your home:  1. Encase mattresses, box springs and pillows with a mite-impermeable barrier or cover  2. Wash sheets, blankets and drapes weekly in hot water (130 F) with detergent and dry them in a dryer on the hot setting.  3. Have the room cleaned frequently with a vacuum cleaner and a damp dust-mop. For carpeting or rugs, vacuuming with a vacuum cleaner equipped with a high-efficiency particulate air (HEPA) filter. The dust mite allergic individual should not be in a room which is being cleaned and should wait 1 hour after cleaning before going into the room.  4. Do not sleep on upholstered furniture (eg, couches).  5. If possible removing carpeting, upholstered furniture and drapery from the home is ideal. Horizontal blinds should be eliminated in the rooms where the person spends the most time (bedroom, study, television room). Washable vinyl, roller-type shades are optimal.  6. Remove all non-washable stuffed toys from the bedroom. Wash stuffed toys weekly like sheets and blankets above.  7. Reduce indoor humidity to less than 50%. Inexpensive humidity monitors can be purchased at most hardware stores. Do not use a humidifier as can make the problem worse and are not recommended.

## 2023-06-12 NOTE — Progress Notes (Signed)
   [address] [place] [address] Dept: [phone]  FOLLOW UP NOTE  Patient ID: Barbara Vazquez, female    DOB: 2000-08-28  Age: 22 y.o. MRN: [id number] Date of Office Visit: 06/12/2023  Assessment  Chief Complaint: No chief complaint on file.  HPI Barbara Vazquez is a 22 year old female who presents to the clinic for follow-up visit.  She was last seen in this clinic on 08/17/2022 by Dr. Dellis Anes for evaluation of asthma, allergic rhinitis, and urticaria.  Her last environmental allergy testing was on 07/06/2022 and was positive to grass pollen, weed pollen, ragweed pollen, tree pollen, indoor mold, outdoor mold, and dust mite. In the interim, she has been to the emergency department on 10/29/2022 for lymph node enlargement and hives.  On 01/14/2023 she visited rheumatology for an initial consult.  On 05/09/2023 she went to the emergency department for evaluation of hives and received steroids.  On 06/06/2023 she went to the emergency department for evaluation of syncope where she had a positive D-dimer, negative CTPA, and negative head CT.  Results listed below Discussed the use of AI scribe software for clinical note transcription with the patient, who gave verbal consent to proceed.  History of Present Illness           Imaging Results - CT Soft Tissue Neck W Contrast (11/22/2022 2:11 PM EDT) Impressions  11/22/2022 2:29 PM EDT  1. Asymmetric, mildly enlarged left level 2 and 3 lymph nodes with benign morphology, likely reactive. 2. Incidental heterogeneous and enlarged thyroid. Recommend non-emergent thyroid ultrasound. Reference: J Am Coll Radiol. 2015 Feb;12(2): 143-50 3. Two subpleural nodular opacities in the right upper lobe measuring up to 6 mm, favored to represent focal atelectasis or intrapulmonary lymph nodes in the absence of known primary malignancy.   Electronically Signed  By: Orvan Falconer M.D.  On: 11/22/2022 14:29   CTA Chest W  Contrast Result Date: 06/06/2023  1. No evidence of a pulmonary embolism. Suboptimal opacification of the peripheral pulmonary arteries. 2. No acute findings. Electronically Signed By: Amie Portland M.D. On: 06/06/2023 07:41   CT Head Wo Contrast Result Date: 06/06/2023  Normal noncontrast Head CT. Electronically Signed By: Odessa Fleming M.D. On: 06/06/2023 04:43    Drug Allergies:  Allergies  Allergen Reactions  . Penicillins Anaphylaxis    As a baby, throat swelled, stopped breathing  . Amoxicillin Hives    Physical Exam: There were no vitals taken for this visit.   Physical Exam  Diagnostics:    Assessment and Plan: No diagnosis found.  No orders of the defined types were placed in this encounter.   There are no Patient Instructions on file for this visit.  No follow-ups on file.    Thank you for the opportunity to care for this patient.  Please do not hesitate to contact me with questions.  Thermon Leyland, FNP [facility]

## 2023-06-13 ENCOUNTER — Encounter: Payer: Self-pay | Admitting: Family Medicine

## 2023-06-13 DIAGNOSIS — J302 Other seasonal allergic rhinitis: Secondary | ICD-10-CM | POA: Insufficient documentation

## 2023-06-13 DIAGNOSIS — L500 Allergic urticaria: Secondary | ICD-10-CM | POA: Insufficient documentation

## 2023-06-13 DIAGNOSIS — T783XXA Angioneurotic edema, initial encounter: Secondary | ICD-10-CM | POA: Insufficient documentation

## 2023-06-13 MED ORDER — PREDNISONE 10 MG PO TABS: ORAL_TABLET | ORAL | 0 refills | Status: DC

## 2023-08-28 ENCOUNTER — Ambulatory Visit: Payer: Self-pay | Admitting: Family Medicine

## 2023-10-21 ENCOUNTER — Other Ambulatory Visit: Payer: Self-pay | Admitting: Allergy & Immunology

## 2023-11-11 ENCOUNTER — Ambulatory Visit: Payer: Self-pay | Admitting: Allergy

## 2023-11-11 DIAGNOSIS — J309 Allergic rhinitis, unspecified: Secondary | ICD-10-CM

## 2023-11-19 ENCOUNTER — Encounter: Payer: Self-pay | Admitting: Family Medicine

## 2024-01-24 LAB — OB RESULTS CONSOLE HEPATITIS B SURFACE ANTIGEN: Hepatitis B Surface Ag: NEGATIVE

## 2024-01-24 LAB — OB RESULTS CONSOLE GC/CHLAMYDIA
Chlamydia: NEGATIVE
Neisseria Gonorrhea: NEGATIVE

## 2024-01-24 LAB — OB RESULTS CONSOLE ANTIBODY SCREEN: Antibody Screen: NEGATIVE

## 2024-01-24 LAB — OB RESULTS CONSOLE RPR: RPR: NONREACTIVE

## 2024-01-24 LAB — OB RESULTS CONSOLE RUBELLA ANTIBODY, IGM: Rubella: NON-IMMUNE/NOT IMMUNE

## 2024-01-24 LAB — HEPATITIS C ANTIBODY: HCV Ab: NEGATIVE

## 2024-03-20 ENCOUNTER — Encounter: Payer: Self-pay | Admitting: *Deleted

## 2024-05-08 NOTE — Telephone Encounter (Signed)
 I spoke with patient and scheduled her ultrasound appointment. She was given the address/phone number to the [facility] location.

## 2024-07-24 ENCOUNTER — Inpatient Hospital Stay (HOSPITAL_COMMUNITY)

## 2024-07-24 ENCOUNTER — Inpatient Hospital Stay (HOSPITAL_COMMUNITY)
Admission: AD | Admit: 2024-07-24 | Discharge: 2024-07-24 | Disposition: A | Discharge status: Home / Self Care | Attending: Obstetrics and Gynecology | Admitting: Obstetrics and Gynecology

## 2024-07-24 ENCOUNTER — Encounter (HOSPITAL_COMMUNITY): Payer: Self-pay | Admitting: Obstetrics and Gynecology

## 2024-07-24 DIAGNOSIS — Z3493 Encounter for supervision of normal pregnancy, unspecified, third trimester: Secondary | ICD-10-CM

## 2024-07-24 DIAGNOSIS — O368130 Decreased fetal movements, third trimester, not applicable or unspecified: Secondary | ICD-10-CM | POA: Insufficient documentation

## 2024-07-24 DIAGNOSIS — O322XX0 Maternal care for transverse and oblique lie, not applicable or unspecified: Secondary | ICD-10-CM

## 2024-07-24 DIAGNOSIS — O321XX1 Maternal care for breech presentation, fetus 1: Secondary | ICD-10-CM | POA: Insufficient documentation

## 2024-07-24 DIAGNOSIS — O30043 Twin pregnancy, dichorionic/diamniotic, third trimester: Secondary | ICD-10-CM

## 2024-07-24 DIAGNOSIS — Z3A35 35 weeks gestation of pregnancy: Secondary | ICD-10-CM | POA: Insufficient documentation

## 2024-07-24 DIAGNOSIS — O479 False labor, unspecified: Secondary | ICD-10-CM

## 2024-07-24 DIAGNOSIS — O322XX2 Maternal care for transverse and oblique lie, fetus 2: Secondary | ICD-10-CM | POA: Insufficient documentation

## 2024-07-24 DIAGNOSIS — O321XX0 Maternal care for breech presentation, not applicable or unspecified: Secondary | ICD-10-CM | POA: Diagnosis not present

## 2024-07-24 DIAGNOSIS — O4703 False labor before 37 completed weeks of gestation, third trimester: Secondary | ICD-10-CM | POA: Diagnosis not present

## 2024-07-24 HISTORY — DX: Autoimmune thyroiditis: E06.3

## 2024-07-24 LAB — URINALYSIS, ROUTINE W REFLEX MICROSCOPIC
Bilirubin Urine: NEGATIVE
Glucose, UA: NEGATIVE mg/dL
Hgb urine dipstick: NEGATIVE
Ketones, ur: 5 mg/dL — AB
Leukocytes,Ua: NEGATIVE
Nitrite: NEGATIVE
Protein, ur: NEGATIVE mg/dL
Specific Gravity, Urine: 1.014 (ref 1.005–1.030)
pH: 6 (ref 5.0–8.0)

## 2024-07-24 NOTE — MAU Note (Signed)
..  Barbara Vazquez is a 24 y.o. at [redacted]w[redacted]d here in MAU reporting:  Woke up around 2330 d/t a contraction and reports that the contraction lasted until 2350. Had another contraction on the way to MAU.  Reports she lost her mucous plug yesterday. Denies vaginal bleeding or leaking of fluid.  Has not felt movement since she woke up.   Pain score: 8/10 Vitals:   07/24/24 0114  BP: 131/62  Pulse: 77  Resp: 17  Temp: 98 F (36.7 C)  SpO2: 99%     FHT: A 130 B 125 Lab orders placed from triage:  UA

## 2024-07-24 NOTE — MAU Provider Note (Signed)
 Chief Complaint:  Abdominal Pain and Decreased Fetal Movement   HPI   Event Date/Time   First Provider Initiated Contact with Patient 07/24/24 0133      Barbara Vazquez is a 24 y.o. G2P1001 at [redacted]w[redacted]d, with DiDi twins who presents to maternity admissions reporting contractions every 15 minutes that started at 2300. She reports they have eased now. Reports loss of mucus plug Wednesday night. Last SCE in office Monday 1cm. Denies LOF or VB. She reports DFM of both babies before arrival. Now A moving well, B still less than normal. She is followed by [facility], and scheduled CS recommended at 37 weeks for breech presentation and twin gestation with growth discordance.    Pregnancy Course: P4W  Past Medical History:  Diagnosis Date   Asthma    Eczema    Gonorrhea    Hashimoto's disease    Mental disorder    anxiety   Teenage parent    March 2020   OB History  Gravida Para Term Preterm AB Living  2 1 1  0 0 1  SAB IAB Ectopic Multiple Live Births  0 0 0  1    # Outcome Date GA Lbr Len/2nd Weight Sex Type Anes PTL Lv  2 Current           1 Term 09/22/18 [redacted]w[redacted]d 03:30 / 00:28 3040 g F Vag-Spont EPI  LIV   Past Surgical History:  Procedure Laterality Date   NO PAST SURGERIES     Family History  Problem Relation Age of Onset   Eczema Mother    Asthma Mother    Mental illness Mother    Lung disease Father    Mental illness Father    ADD / ADHD Brother    Asthma Maternal Grandmother    Lung disease Maternal Grandmother    Lung disease Maternal Grandfather    Cancer Paternal Grandmother        skin   Lung disease Paternal Grandfather    Social History[1] Allergies[2] Medications Prior to Admission  Medication Sig Dispense Refill Last Dose/Taking   cholecalciferol (VITAMIN D3) 25 MCG (1000 UNIT) tablet Take 1,000 Units by mouth daily.   07/23/2024   levothyroxine (SYNTHROID) 100 MCG tablet Take 100 mcg by mouth daily before breakfast.   07/23/2024   Prenatal Vit-Fe Fumarate-FA  (MULTIVITAMIN-PRENATAL) 27-0.8 MG TABS tablet Take 1 tablet by mouth daily at 12 noon.   07/23/2024   azelastine  (ASTELIN ) 0.1 % nasal spray Place 2 sprays per nostril 1-2 times daily as needed 30 mL 5    busPIRone (BUSPAR) 5 MG tablet Take by mouth. (Patient not taking: Reported on 06/12/2023)      cetirizine  (ZYRTEC ) 10 MG tablet Take 1 tablet by mouth daily.      diphenhydrAMINE  (BENADRYL ) 25 mg capsule Take 25 mg by mouth every 6 (six) hours as needed.      EPINEPHrine 0.3 mg/0.3 mL IJ SOAJ injection Inject 0.3 mg into the muscle as needed.      famotidine (PEPCID) 20 MG tablet Take by mouth.      fexofenadine (ALLEGRA) 180 MG tablet Take by mouth.      fluticasone  (FLONASE ) 50 MCG/ACT nasal spray Place 2 sprays into both nostrils daily.      meloxicam  (MOBIC ) 7.5 MG tablet Take 1 tablet (7.5 mg total) by mouth daily. (Patient not taking: Reported on 06/12/2023) 14 tablet 0    predniSONE  (DELTASONE ) 10 MG tablet Take 2 tablets twice a day for 3 days,  then take 2 tablets once a day for 1 day, then take 1 tablet on the 5th day, then stop 15 tablet 0    Vitamin D , Ergocalciferol , (DRISDOL ) 1.25 MG (50000 UNIT) CAPS capsule TAKE 1 CAPSULE BY MOUTH ONE TIME PER WEEK (Patient not taking: Reported on 06/12/2023) 5 capsule 0     I have reviewed patient's Past Medical Hx, Surgical Hx, Family Hx, Social Hx, medications and allergies.   ROS  Pertinent items noted in HPI and remainder of comprehensive ROS otherwise negative.   PHYSICAL EXAM  Patient Vitals for the past 24 hrs:  BP Temp Temp src Pulse Resp SpO2 Height Weight  07/24/24 0114 131/62 98 F (36.7 C) Oral 77 17 99 % 5' 4 (1.626 m) 116.8 kg    Physical Exam Vitals and nursing note reviewed.  Constitutional:      General: She is not in acute distress.    Appearance: Normal appearance. She is well-developed. She is not ill-appearing, toxic-appearing or diaphoretic.  HENT:     Head: Normocephalic.  Cardiovascular:     Rate and Rhythm:  Normal rate.     Pulses: Normal pulses.  Pulmonary:     Effort: Pulmonary effort is normal.  Abdominal:     Comments: Gravid, appropriate for twin gestation at approximately 36 weeks  Skin:    General: Skin is warm and dry.     Capillary Refill: Capillary refill takes less than 2 seconds.  Neurological:     General: No focal deficit present.     Mental Status: She is alert and oriented to person, place, and time.  Psychiatric:        Mood and Affect: Mood normal.        Behavior: Behavior normal.        Thought Content: Thought content normal.        Judgment: Judgment normal.      Dilation: 2 Effacement (%): Thick Exam by:: Camie Rote, CNM  Fetal Tracing A: Baseline: 125 Variability: moderate Accelerations: 15x15 Decelerations:absent Toco: 2-6 Fetal Tracing: Baseline: 135 Variability:moderate Accelerations: 15x15 Decelerations:absent   Labs: Results for orders placed or performed during the hospital encounter of 07/24/24 (from the past 24 hours)  Urinalysis, Routine w reflex microscopic -Urine, Clean Catch     Status: Abnormal   Collection Time: 07/24/24  1:38 AM  Result Value Ref Range   Color, Urine YELLOW YELLOW   APPearance HAZY (A) CLEAR   Specific Gravity, Urine 1.014 1.005 - 1.030   pH 6.0 5.0 - 8.0   Glucose, UA NEGATIVE NEGATIVE mg/dL   Hgb urine dipstick NEGATIVE NEGATIVE   Bilirubin Urine NEGATIVE NEGATIVE   Ketones, ur 5 (A) NEGATIVE mg/dL   Protein, ur NEGATIVE NEGATIVE mg/dL   Nitrite NEGATIVE NEGATIVE   Leukocytes,Ua NEGATIVE NEGATIVE    Imaging:  No results found.  MDM & MAU COURSE  MDM:  Moderate  Evaluate for PTL in didi twin gestation. Given [redacted]w[redacted]d, would not collect FFN. SCE changed from in office exam. 2/50/-3 posterior and moderate in consistency. Plan to recheck in 1-2 hours based on presentatoin.  Able to indicate fetal movement - though initial report was twin A only, twin B with less movement. Instructed to only click  for twin B, after return to monitor.  BPP ordered due to difficulty tracing FHR and concern for movement perception (though improved).   MAU Course: Orders Placed This Encounter  Procedures   US  MFM Fetal BPP Wo Non Stress   US  MFM  FETAL BPP WO NST ADDL GESTATION   Urinalysis, Routine w reflex microscopic -Urine, Clean Catch   Discharge patient   No orders of the defined types were placed in this encounter.   ASSESSMENT   1. False labor   2. Movement of fetus present during pregnancy in third trimester   3. [redacted] weeks gestation of pregnancy     PLAN  Discharge home in stable condition.  Follow up as indicated at P4W.  Return to MAU as needed.     Follow-up Information     [facility] [place] Follow up.   Contact information: [address] [place] [address] [phone]                 Allergies as of 07/24/2024       Reactions   Penicillins Anaphylaxis   As a baby, throat swelled, stopped breathing   Amoxicillin Hives        Medication List     STOP taking these medications    busPIRone 5 MG tablet Commonly known as: BUSPAR   meloxicam  7.5 MG tablet Commonly known as: Mobic    predniSONE  10 MG tablet Commonly known as: DELTASONE    Vitamin D  (Ergocalciferol ) 1.25 MG (50000 UNIT) Caps capsule Commonly known as: DRISDOL        TAKE these medications    azelastine  0.1 % nasal spray Commonly known as: ASTELIN  Place 2 sprays per nostril 1-2 times daily as needed   cetirizine  10 MG tablet Commonly known as: ZYRTEC  Take 1 tablet by mouth daily.   cholecalciferol 25 MCG (1000 UNIT) tablet Commonly known as: VITAMIN D3 Take 1,000 Units by mouth daily.   diphenhydrAMINE  25 mg capsule Commonly known as: BENADRYL  Take 25 mg by mouth every 6 (six) hours as needed.   EPINEPHrine 0.3 mg/0.3 mL Soaj injection Commonly known as: EPI-PEN Inject 0.3 mg into the muscle as needed.   famotidine 20 MG tablet Commonly  known as: PEPCID Take by mouth.   fexofenadine 180 MG tablet Commonly known as: ALLEGRA Take by mouth.   fluticasone  50 MCG/ACT nasal spray Commonly known as: FLONASE  Place 2 sprays into both nostrils daily.   levothyroxine 100 MCG tablet Commonly known as: SYNTHROID Take 100 mcg by mouth daily before breakfast.   multivitamin-prenatal 27-0.8 MG Tabs tablet Take 1 tablet by mouth daily at 12 noon.        Camie Rote, MSN, CNM 07/24/2024 3:10 AM  Certified Nurse Midwife, [facility]        [1]  Social History Tobacco Use   Smoking status: Former    Types: E-cigarettes   Smokeless tobacco: Never   Tobacco comments:    once a week  Vaping Use   Vaping status: Former  Substance Use Topics   Alcohol use: No   Drug use: No  [2]  Allergies Allergen Reactions   Penicillins Anaphylaxis    As a baby, throat swelled, stopped breathing   Amoxicillin Hives

## 2024-07-27 ENCOUNTER — Encounter (HOSPITAL_COMMUNITY): Payer: Self-pay

## 2024-07-27 NOTE — Patient Instructions (Signed)
"   Barbara Vazquez  07/27/2024   Your procedure is scheduled on:  08/05/2024  Arrive at 0830 at Entrance C on [address] [facility]  and [employer]. You are invited to use the FREE valet parking or use the Visitor's parking deck.  Pick up the phone at the desk and dial [phone].  Call this number if you have problems the morning of surgery: [phone]  Remember:   Do not eat food:(After Midnight) Desps de medianoche.  You may drink clear liquids until  __0630___.  Clear liquids means a liquid you can see thru.  It can have color such as Cola or Kool aid.  Tea is OK and coffee as long as no milk or creamer of any kind.  Take these medicines the morning of surgery with A SIP OF WATER:  levothyroxine   Do not wear jewelry, make-up or nail polish.  Do not wear lotions, powders, or perfumes. Do not wear deodorant.  Do not shave 48 hours prior to surgery.  Do not bring valuables to the hospital.  [facility] is not   responsible for any belongings or valuables brought to the hospital.  Contacts, dentures or bridgework may not be worn into surgery.  Leave suitcase in the car. After surgery it may be brought to your room.  For patients admitted to the hospital, checkout time is 11:00 AM the day of              discharge.      Please read over the following fact sheets that you were given:     Preparing for Surgery   "

## 2024-08-03 ENCOUNTER — Encounter (HOSPITAL_COMMUNITY)
Admission: RE | Admit: 2024-08-03 | Discharge: 2024-08-03 | Disposition: A | Source: Ambulatory Visit | Attending: Obstetrics and Gynecology

## 2024-08-03 DIAGNOSIS — Z01812 Encounter for preprocedural laboratory examination: Secondary | ICD-10-CM | POA: Insufficient documentation

## 2024-08-03 HISTORY — DX: Hypothyroidism, unspecified: E03.9

## 2024-08-03 LAB — CBC
HCT: 37.7 % (ref 36.0–46.0)
Hemoglobin: 12.5 g/dL (ref 12.0–15.0)
MCH: 30.1 pg (ref 26.0–34.0)
MCHC: 33.2 g/dL (ref 30.0–36.0)
MCV: 90.8 fL (ref 80.0–100.0)
Platelets: 279 10*3/uL (ref 150–400)
RBC: 4.15 MIL/uL (ref 3.87–5.11)
RDW: 14.1 % (ref 11.5–15.5)
WBC: 8.6 10*3/uL (ref 4.0–10.5)
nRBC: 0 % (ref 0.0–0.2)

## 2024-08-03 LAB — SYPHILIS: RPR W/REFLEX TO RPR TITER AND TREPONEMAL ANTIBODIES, TRADITIONAL SCREENING AND DIAGNOSIS ALGORITHM: RPR Ser Ql: NONREACTIVE

## 2024-08-04 NOTE — H&P (Signed)
 Barbara Vazquez is a 24 y.o. female presenting for scheduled cesarean section. Di-di twin gestation with malpresentation of both twins (were last breech/breech). B with FGR AC 9*%ile; dopplers have been normal. Has hashimoto's thyroiditis, on synthroid . 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   OB History     Gravida  2   Para  1   Term  1   Preterm  0   AB  0   Living  1      SAB  0   IAB  0   Ectopic  0   Multiple      Live Births  1          Past Medical History:  Diagnosis Date   Asthma    Eczema    Gonorrhea    Hashimoto's disease    Hypothyroidism    Mental disorder    anxiety   Teenage parent    March 2020   Past Surgical History:  Procedure Laterality Date   NO PAST SURGERIES     WISDOM TOOTH EXTRACTION     Family History: family history includes ADD / ADHD in her brother; Asthma in her maternal grandmother and mother; Cancer in her paternal grandmother; Diabetes in her father; Eczema in her mother; Lung disease in her father, maternal grandfather, maternal grandmother, and paternal grandfather; Mental illness in her father and mother. Social History:  reports that she has quit smoking. Her smoking use included e-cigarettes. She has never used smokeless tobacco. She reports that she does not drink alcohol and does not use drugs.     Maternal Diabetes: No Genetic Screening: Normal Maternal Ultrasounds/Referrals: Normal Fetal Ultrasounds or other Referrals:  Referred to Materal Fetal Medicine  Maternal Substance Abuse:  No Significant Maternal Medications:  Synthroid  Significant Maternal Lab Results:  GBS unknown (pending) Number of Prenatal Visits:greater than 3 verified prenatal visits     07/27/2024    3:33 PM 07/24/2024    3:25 AM 07/24/2024    1:14 AM  Vitals with BMI  Height 5' 4  5' 4  Weight 256 lbs  257 lbs 6 oz  BMI 43.92  44.16  Systolic  125 131  Diastolic  67 62  Pulse  74 77  Vitals to be taken  on admission  Constitutional:      Appearance: Normal appearance.  HENT:     Head: Normocephalic.  Cardiovascular:     Rate and Rhythm: Normal rate    Pulses: Normal pulses.  Abdominal:     General: Abdomen is Gravid, nontender Neurological:     Mental Status: She is alert.  Prenatal labs: ABO, Rh: --/--/A POS (02/02 1037) Antibody: NEG (02/02 1037) Rubella: Nonimmune (07/25 0000) RPR: NON REACTIVE (02/02 1030)  HBsAg: Negative (07/25 0000)  HIV:    GBS:     Assessment/Plan: 23yo G2P1001 at [redacted]w[redacted]d presenting for scheduled cesarean section for twin gestation and malpresentation as well as FGR of twin B.  Risks discussed including infection, bleeding, damage to surrounding structures, the need for additional procedures including hysterectomy, and the possibility of uterine rupture with neonatal morbidity/mortality, scarring, and abnormal placentation with subsequent pregnancies. Patient agrees to proceed.  Continue home synthroid . GBS pending    Slater JINNY Door 08/04/2024, 7:14 AM

## 2024-08-05 ENCOUNTER — Inpatient Hospital Stay (HOSPITAL_COMMUNITY): Admitting: Anesthesiology

## 2024-08-05 ENCOUNTER — Encounter (HOSPITAL_COMMUNITY): Payer: Self-pay | Admitting: Obstetrics and Gynecology

## 2024-08-05 ENCOUNTER — Inpatient Hospital Stay (HOSPITAL_COMMUNITY): Admission: RE | Admit: 2024-08-05 | Source: Home / Self Care | Admitting: Obstetrics and Gynecology

## 2024-08-05 ENCOUNTER — Other Ambulatory Visit: Payer: Self-pay

## 2024-08-05 ENCOUNTER — Encounter (HOSPITAL_COMMUNITY): Admission: RE | Payer: Self-pay | Source: Home / Self Care

## 2024-08-05 DIAGNOSIS — Z3490 Encounter for supervision of normal pregnancy, unspecified, unspecified trimester: Secondary | ICD-10-CM

## 2024-08-05 DIAGNOSIS — Z98891 History of uterine scar from previous surgery: Principal | ICD-10-CM

## 2024-08-05 LAB — CBC
HCT: 27.4 % — ABNORMAL LOW (ref 36.0–46.0)
Hemoglobin: 9.2 g/dL — ABNORMAL LOW (ref 12.0–15.0)
MCH: 30.5 pg (ref 26.0–34.0)
MCHC: 33.6 g/dL (ref 30.0–36.0)
MCV: 90.7 fL (ref 80.0–100.0)
Platelets: 210 10*3/uL (ref 150–400)
RBC: 3.02 MIL/uL — ABNORMAL LOW (ref 3.87–5.11)
RDW: 13.8 % (ref 11.5–15.5)
WBC: 10.6 10*3/uL — ABNORMAL HIGH (ref 4.0–10.5)
nRBC: 0 % (ref 0.0–0.2)

## 2024-08-05 LAB — PROTIME-INR
INR: 1 (ref 0.8–1.2)
Prothrombin Time: 14.2 s (ref 11.4–15.2)

## 2024-08-05 LAB — APTT: aPTT: 27 s (ref 24–36)

## 2024-08-05 LAB — DIC (DISSEMINATED INTRAVASCULAR COAGULATION)PANEL
D-Dimer, Quant: 2.61 ug{FEU}/mL — ABNORMAL HIGH (ref 0.00–0.50)
Fibrinogen: 429 mg/dL (ref 210–475)
INR: 1.1 (ref 0.8–1.2)
Platelets: 219 10*3/uL (ref 150–400)
Prothrombin Time: 14.3 s (ref 11.4–15.2)
Smear Review: NONE SEEN
aPTT: 27 s (ref 24–36)

## 2024-08-05 LAB — FIBRINOGEN: Fibrinogen: 421 mg/dL (ref 210–475)

## 2024-08-05 LAB — PREPARE RBC (CROSSMATCH)

## 2024-08-05 MED ORDER — OXYTOCIN-SODIUM CHLORIDE 30-0.9 UT/500ML-% IV SOLN
2.5000 [IU]/h | INTRAVENOUS | Status: AC
  Administered 2024-08-05: 2.5 [IU]/h via INTRAVENOUS
  Filled 2024-08-05: qty 500

## 2024-08-05 MED ORDER — SCOPOLAMINE 1 MG/3DAYS TD PT72
MEDICATED_PATCH | TRANSDERMAL | Status: AC
  Filled 2024-08-05: qty 1

## 2024-08-05 MED ORDER — DEXMEDETOMIDINE HCL IN NACL 80 MCG/20ML IV SOLN
INTRAVENOUS | Status: DC | PRN
  Administered 2024-08-05 (×2): 4 ug via INTRAVENOUS

## 2024-08-05 MED ORDER — ALBUMIN HUMAN 5 % IV SOLN: INTRAVENOUS | Status: DC | PRN

## 2024-08-05 MED ORDER — ONDANSETRON HCL 4 MG/2ML IJ SOLN: 4.0000 mg | Freq: Three times a day (TID) | INTRAMUSCULAR | Status: AC | PRN

## 2024-08-05 MED ORDER — FAMOTIDINE 20 MG PO TABS
20.0000 mg | ORAL_TABLET | Freq: Once | ORAL | Status: AC
  Administered 2024-08-05: 20 mg via ORAL

## 2024-08-05 MED ORDER — KETOROLAC TROMETHAMINE 30 MG/ML IJ SOLN: 30.0000 mg | Freq: Four times a day (QID) | INTRAMUSCULAR | Status: DC | PRN

## 2024-08-05 MED ORDER — DEXAMETHASONE SOD PHOSPHATE PF 10 MG/ML IJ SOLN
INTRAMUSCULAR | Status: AC
  Filled 2024-08-05: qty 1

## 2024-08-05 MED ORDER — BUPIVACAINE IN DEXTROSE 0.75-8.25 % IT SOLN
INTRATHECAL | Status: DC | PRN
  Administered 2024-08-05: 1.6 mL via INTRATHECAL

## 2024-08-05 MED ORDER — SODIUM CHLORIDE 0.9% IV SOLUTION: Freq: Once | INTRAVENOUS | Status: AC

## 2024-08-05 MED ORDER — CHLORHEXIDINE GLUCONATE 0.12 % MT SOLN
15.0000 mL | Freq: Once | OROMUCOSAL | Status: AC
  Administered 2024-08-05: 15 mL via OROMUCOSAL

## 2024-08-05 MED ORDER — MENTHOL 3 MG MT LOZG: 1.0000 | LOZENGE | OROMUCOSAL | Status: AC | PRN

## 2024-08-05 MED ORDER — LACTATED RINGERS IV SOLN: INTRAVENOUS | Status: AC

## 2024-08-05 MED ORDER — DEXMEDETOMIDINE HCL IN NACL 80 MCG/20ML IV SOLN
INTRAVENOUS | Status: AC
  Filled 2024-08-05: qty 20

## 2024-08-05 MED ORDER — SIMETHICONE 80 MG PO CHEW
80.0000 mg | CHEWABLE_TABLET | Freq: Three times a day (TID) | ORAL | Status: AC
  Administered 2024-08-05 – 2024-08-07 (×6): 80 mg via ORAL
  Filled 2024-08-05 (×7): qty 1

## 2024-08-05 MED ORDER — NALOXONE HCL 4 MG/10ML IJ SOLN: 1.0000 ug/kg/h | INTRAVENOUS | Status: AC | PRN

## 2024-08-05 MED ORDER — DIBUCAINE (PERIANAL) 1 % EX OINT: 1.0000 | TOPICAL_OINTMENT | CUTANEOUS | Status: AC | PRN

## 2024-08-05 MED ORDER — ONDANSETRON HCL 4 MG/2ML IJ SOLN
INTRAMUSCULAR | Status: AC
  Filled 2024-08-05: qty 2

## 2024-08-05 MED ORDER — DIPHENHYDRAMINE HCL 25 MG PO CAPS
25.0000 mg | ORAL_CAPSULE | ORAL | Status: AC | PRN
  Filled 2024-08-05: qty 1

## 2024-08-05 MED ORDER — ACETAMINOPHEN 10 MG/ML IV SOLN: 1000.0000 mg | Freq: Four times a day (QID) | INTRAVENOUS | Status: DC

## 2024-08-05 MED ORDER — PRENATAL MULTIVITAMIN CH
1.0000 | ORAL_TABLET | Freq: Every day | ORAL | Status: AC
  Administered 2024-08-05 – 2024-08-07 (×3): 1 via ORAL
  Filled 2024-08-05 (×3): qty 1

## 2024-08-05 MED ORDER — ACETAMINOPHEN 500 MG PO TABS: 1000.0000 mg | ORAL_TABLET | Freq: Four times a day (QID) | ORAL | Status: DC

## 2024-08-05 MED ORDER — KETOROLAC TROMETHAMINE 30 MG/ML IJ SOLN
30.0000 mg | Freq: Four times a day (QID) | INTRAMUSCULAR | Status: AC
  Administered 2024-08-05 – 2024-08-06 (×4): 30 mg via INTRAVENOUS
  Filled 2024-08-05 (×4): qty 1

## 2024-08-05 MED ORDER — ALBUMIN HUMAN 5 % IV SOLN
INTRAVENOUS | Status: AC
  Filled 2024-08-05: qty 250

## 2024-08-05 MED ORDER — FENTANYL CITRATE (PF) 100 MCG/2ML IJ SOLN
INTRAMUSCULAR | Status: AC
  Filled 2024-08-05: qty 2

## 2024-08-05 MED ORDER — ONDANSETRON HCL 4 MG/2ML IJ SOLN: 4.0000 mg | Freq: Once | INTRAMUSCULAR | Status: DC | PRN

## 2024-08-05 MED ORDER — KETOROLAC TROMETHAMINE 30 MG/ML IJ SOLN
30.0000 mg | Freq: Four times a day (QID) | INTRAMUSCULAR | Status: DC | PRN
  Administered 2024-08-05: 30 mg via INTRAVENOUS

## 2024-08-05 MED ORDER — OXYCODONE HCL 5 MG PO TABS: 5.0000 mg | ORAL_TABLET | ORAL | Status: AC | PRN

## 2024-08-05 MED ORDER — METOCLOPRAMIDE HCL 5 MG/ML IJ SOLN
INTRAMUSCULAR | Status: DC | PRN
  Administered 2024-08-05: 10 mg via INTRAVENOUS

## 2024-08-05 MED ORDER — NALOXONE HCL 0.4 MG/ML IJ SOLN: 0.4000 mg | INTRAMUSCULAR | Status: AC | PRN

## 2024-08-05 MED ORDER — PHENYLEPHRINE HCL-NACL 20-0.9 MG/250ML-% IV SOLN
INTRAVENOUS | Status: DC | PRN
  Administered 2024-08-05: 60 ug/min via INTRAVENOUS

## 2024-08-05 MED ORDER — ORAL CARE MOUTH RINSE: 15.0000 mL | Freq: Once | OROMUCOSAL | Status: AC

## 2024-08-05 MED ORDER — SOD CITRATE-CITRIC ACID 500-334 MG/5ML PO SOLN
ORAL | Status: AC
  Filled 2024-08-05: qty 30

## 2024-08-05 MED ORDER — LEVOTHYROXINE SODIUM 100 MCG PO TABS
100.0000 ug | ORAL_TABLET | Freq: Every day | ORAL | Status: AC
  Administered 2024-08-06 – 2024-08-07 (×2): 100 ug via ORAL
  Filled 2024-08-05 (×2): qty 1

## 2024-08-05 MED ORDER — OXYTOCIN-SODIUM CHLORIDE 30-0.9 UT/500ML-% IV SOLN
INTRAVENOUS | Status: DC | PRN
  Administered 2024-08-05: 250 mL via INTRAVENOUS

## 2024-08-05 MED ORDER — SOD CITRATE-CITRIC ACID 500-334 MG/5ML PO SOLN
30.0000 mL | Freq: Once | ORAL | Status: AC
  Administered 2024-08-05: 30 mL via ORAL

## 2024-08-05 MED ORDER — DIPHENHYDRAMINE HCL 25 MG PO CAPS: 25.0000 mg | ORAL_CAPSULE | Freq: Four times a day (QID) | ORAL | Status: AC | PRN

## 2024-08-05 MED ORDER — CLINDAMYCIN PHOSPHATE 900 MG/50ML IV SOLN
INTRAVENOUS | Status: AC
  Filled 2024-08-05: qty 50

## 2024-08-05 MED ORDER — INFLUENZA VIRUS VACC SPLIT PF (FLUZONE) 0.5 ML IM SUSY
0.5000 mL | PREFILLED_SYRINGE | INTRAMUSCULAR | Status: AC
  Administered 2024-08-07: 0.5 mL via INTRAMUSCULAR
  Filled 2024-08-05: qty 0.5

## 2024-08-05 MED ORDER — LACTATED RINGERS IV SOLN: INTRAVENOUS | Status: DC

## 2024-08-05 MED ORDER — STERILE WATER FOR IRRIGATION IR SOLN
Status: DC | PRN
  Administered 2024-08-05: 1000 mL

## 2024-08-05 MED ORDER — DEXAMETHASONE SOD PHOSPHATE PF 10 MG/ML IJ SOLN
INTRAMUSCULAR | Status: DC | PRN
  Administered 2024-08-05: 10 mg via INTRAVENOUS

## 2024-08-05 MED ORDER — SOD CITRATE-CITRIC ACID 500-334 MG/5ML PO SOLN: 30.0000 mL | ORAL | Status: AC

## 2024-08-05 MED ORDER — CLINDAMYCIN PHOSPHATE 900 MG/50ML IV SOLN
900.0000 mg | Freq: Once | INTRAVENOUS | Status: AC
  Administered 2024-08-05: 900 mg via INTRAVENOUS

## 2024-08-05 MED ORDER — COCONUT OIL OIL
1.0000 | TOPICAL_OIL | Status: AC | PRN
  Administered 2024-08-07: 1 via TOPICAL

## 2024-08-05 MED ORDER — FAMOTIDINE 20 MG PO TABS: 20.0000 mg | ORAL_TABLET | Freq: Every evening | ORAL | Status: AC | PRN

## 2024-08-05 MED ORDER — KETOROLAC TROMETHAMINE 30 MG/ML IJ SOLN
INTRAMUSCULAR | Status: AC
  Filled 2024-08-05: qty 1

## 2024-08-05 MED ORDER — FAMOTIDINE 20 MG PO TABS
ORAL_TABLET | ORAL | Status: AC
  Filled 2024-08-05: qty 1

## 2024-08-05 MED ORDER — SENNOSIDES-DOCUSATE SODIUM 8.6-50 MG PO TABS
2.0000 | ORAL_TABLET | Freq: Every day | ORAL | Status: AC
  Administered 2024-08-06 – 2024-08-07 (×2): 2 via ORAL
  Filled 2024-08-05 (×2): qty 2

## 2024-08-05 MED ORDER — ACETAMINOPHEN 500 MG PO TABS
1000.0000 mg | ORAL_TABLET | Freq: Four times a day (QID) | ORAL | Status: AC
  Administered 2024-08-05 – 2024-08-07 (×10): 1000 mg via ORAL
  Filled 2024-08-05 (×10): qty 2

## 2024-08-05 MED ORDER — SODIUM CHLORIDE 0.9% FLUSH: 3.0000 mL | INTRAVENOUS | Status: AC | PRN

## 2024-08-05 MED ORDER — WITCH HAZEL-GLYCERIN EX PADS: 1.0000 | MEDICATED_PAD | CUTANEOUS | Status: AC | PRN

## 2024-08-05 MED ORDER — OXYTOCIN-SODIUM CHLORIDE 30-0.9 UT/500ML-% IV SOLN
INTRAVENOUS | Status: AC
  Filled 2024-08-05: qty 500

## 2024-08-05 MED ORDER — CHLORHEXIDINE GLUCONATE 0.12 % MT SOLN
OROMUCOSAL | Status: AC
  Filled 2024-08-05: qty 15

## 2024-08-05 MED ORDER — DIPHENHYDRAMINE HCL 50 MG/ML IJ SOLN
12.5000 mg | INTRAMUSCULAR | Status: AC | PRN
  Administered 2024-08-06: 12.5 mg via INTRAVENOUS
  Filled 2024-08-05: qty 1

## 2024-08-05 MED ORDER — SCOPOLAMINE 1 MG/3DAYS TD PT72: 1.0000 | MEDICATED_PATCH | Freq: Once | TRANSDERMAL | Status: AC

## 2024-08-05 MED ORDER — ACETAMINOPHEN 10 MG/ML IV SOLN
INTRAVENOUS | Status: DC | PRN
  Administered 2024-08-05: 1000 mg via INTRAVENOUS

## 2024-08-05 MED ORDER — FENTANYL CITRATE (PF) 100 MCG/2ML IJ SOLN: 25.0000 ug | INTRAMUSCULAR | Status: DC | PRN

## 2024-08-05 MED ORDER — ONDANSETRON HCL 4 MG/2ML IJ SOLN
INTRAMUSCULAR | Status: DC | PRN
  Administered 2024-08-05: 4 mg via INTRAVENOUS

## 2024-08-05 MED ORDER — MEPERIDINE HCL 25 MG/ML IJ SOLN: 6.2500 mg | INTRAMUSCULAR | Status: DC | PRN

## 2024-08-05 MED ORDER — IBUPROFEN 600 MG PO TABS
600.0000 mg | ORAL_TABLET | Freq: Four times a day (QID) | ORAL | Status: AC
  Administered 2024-08-06 – 2024-08-07 (×6): 600 mg via ORAL
  Filled 2024-08-05 (×6): qty 1

## 2024-08-05 MED ORDER — ACETAMINOPHEN 10 MG/ML IV SOLN
INTRAVENOUS | Status: AC
  Filled 2024-08-05: qty 100

## 2024-08-05 MED ORDER — GENTAMICIN SULFATE 40 MG/ML IJ SOLN
5.0000 mg/kg | Freq: Once | INTRAVENOUS | Status: AC
  Administered 2024-08-05: 400 mg via INTRAVENOUS
  Filled 2024-08-05: qty 10

## 2024-08-05 MED ORDER — MORPHINE SULFATE (PF) 0.5 MG/ML IJ SOLN
INTRAMUSCULAR | Status: DC | PRN
  Administered 2024-08-05: 150 ug via INTRATHECAL

## 2024-08-05 MED ORDER — FENTANYL CITRATE (PF) 100 MCG/2ML IJ SOLN
INTRAMUSCULAR | Status: DC | PRN
  Administered 2024-08-05: 15 ug via INTRATHECAL

## 2024-08-05 MED ORDER — MORPHINE SULFATE (PF) 0.5 MG/ML IJ SOLN
INTRAMUSCULAR | Status: AC
  Filled 2024-08-05: qty 10

## 2024-08-05 MED ORDER — SIMETHICONE 80 MG PO CHEW: 80.0000 mg | CHEWABLE_TABLET | ORAL | Status: AC | PRN

## 2024-08-05 MED ORDER — TRANEXAMIC ACID-NACL 1000-0.7 MG/100ML-% IV SOLN
INTRAVENOUS | Status: DC | PRN
  Administered 2024-08-05: 1000 mg via INTRAVENOUS

## 2024-08-05 MED ORDER — PHENYLEPHRINE HCL-NACL 20-0.9 MG/250ML-% IV SOLN
INTRAVENOUS | Status: AC
  Filled 2024-08-05: qty 250

## 2024-08-05 MED ORDER — METOCLOPRAMIDE HCL 5 MG/ML IJ SOLN
INTRAMUSCULAR | Status: AC
  Filled 2024-08-05: qty 2

## 2024-08-05 MED ORDER — PHENYLEPHRINE 80 MCG/ML (10ML) SYRINGE FOR IV PUSH (FOR BLOOD PRESSURE SUPPORT)
PREFILLED_SYRINGE | INTRAVENOUS | Status: AC
  Filled 2024-08-05: qty 10

## 2024-08-05 MED ORDER — ZOLPIDEM TARTRATE 5 MG PO TABS: 5.0000 mg | ORAL_TABLET | Freq: Every evening | ORAL | Status: AC | PRN

## 2024-08-05 MED ORDER — SCOPOLAMINE 1 MG/3DAYS TD PT72
1.0000 | MEDICATED_PATCH | Freq: Once | TRANSDERMAL | Status: AC
  Administered 2024-08-05: 1 mg via TRANSDERMAL

## 2024-08-05 MED ORDER — MORPHINE SULFATE (PF) 2 MG/ML IV SOLN: 1.0000 mg | INTRAVENOUS | Status: AC | PRN

## 2024-08-05 MED ORDER — AMISULPRIDE (ANTIEMETIC) 5 MG/2ML IV SOLN: 10.0000 mg | Freq: Once | INTRAVENOUS | Status: DC | PRN

## 2024-08-05 NOTE — Anesthesia Preprocedure Evaluation (Signed)
"                                    Anesthesia Evaluation    Reviewed: Allergy  & Precautions, Patient's Chart, lab work & pertinent test results  Airway Mallampati: II  TM Distance: >3 FB Neck ROM: Full    Dental  (+) Teeth Intact, Dental Advisory Given   Pulmonary asthma , former smoker   Pulmonary exam normal breath sounds clear to auscultation       Cardiovascular negative cardio ROS Normal cardiovascular exam Rhythm:Regular Rate:Normal     Neuro/Psych  PSYCHIATRIC DISORDERS Anxiety Depression    negative neurological ROS     GI/Hepatic negative GI ROS, Neg liver ROS,,,  Endo/Other  Hypothyroidism  Class 3 obesity  Renal/GU negative Renal ROS     Musculoskeletal negative musculoskeletal ROS (+)    Abdominal   Peds  Hematology negative hematology ROS (+) Plt 279k   Anesthesia Other Findings Day of surgery medications reviewed with the patient.  Reproductive/Obstetrics                              Anesthesia Physical Anesthesia Plan  ASA: 3  Anesthesia Plan: Spinal   Post-op Pain Management:    Induction:   PONV Risk Score and Plan: 2 and Scopolamine  patch - Pre-op, Dexamethasone  and Ondansetron   Airway Management Planned: Natural Airway  Additional Equipment:   Intra-op Plan:   Post-operative Plan:   Informed Consent:   Plan Discussed with:   Anesthesia Plan Comments:         Anesthesia Quick Evaluation  "

## 2024-08-05 NOTE — Progress Notes (Signed)
 LR bolus started in PACU.

## 2024-08-05 NOTE — Anesthesia Procedure Notes (Signed)
 Spinal  Patient location during procedure: OR Start time: 08/05/2024 8:53 AM End time: 08/05/2024 8:56 AM Reason for block: surgical anesthesia  Staffing Performed: anesthesiologist  Authorized by: Corinne Garnette BRAVO, MD   Performed by: Corinne Garnette BRAVO, MD  Preanesthetic Checklist Completed: patient identified, IV checked, risks and benefits discussed, surgical consent, monitors and equipment checked, pre-op evaluation and timeout performed Spinal Block Patient position: sitting Prep: DuraPrep and site prepped and draped Patient monitoring: continuous pulse ox and blood pressure Approach: midline Location: L3-4 Injection technique: single-shot Needle Needle type: Pencan  Needle gauge: 24 G Assessment Events: CSF return  Additional Notes Functioning IV was confirmed and monitors were applied. Sterile prep and drape, including hand hygiene, mask and sterile gloves were used. The patient was positioned and the spine was prepped. The skin was anesthetized with lidocaine .  Free flow of clear CSF was obtained prior to injecting local anesthetic into the CSF.  The spinal needle aspirated freely following injection.  The needle was carefully withdrawn.  The patient tolerated the procedure well. Consent was obtained prior to procedure with all questions answered and concerns addressed. Risks including but not limited to bleeding, infection, nerve damage, paralysis, failed block, inadequate analgesia, allergic reaction, high spinal, itching and headache were discussed and the patient wished to proceed.   Garnette Corinne, MD

## 2024-08-05 NOTE — Anesthesia Postprocedure Evaluation (Signed)
"   Anesthesia Post Note  Patient: Barbara Vazquez  Procedure(s) Performed: CESAREAN DELIVERY (Abdomen)     Patient location during evaluation: Mother Baby Anesthesia Type: Spinal Level of consciousness: awake, awake and alert and oriented Pain management: pain level controlled Vital Signs Assessment: post-procedure vital signs reviewed and stable Respiratory status: spontaneous breathing, nonlabored ventilation and respiratory function stable Cardiovascular status: blood pressure returned to baseline and stable Postop Assessment: no headache, no backache, spinal receding and no apparent nausea or vomiting Anesthetic complications: no   No notable events documented.  Last Vitals:  Vitals:   08/05/24 1130 08/05/24 1150  BP:  (!) 99/40  Pulse: 62 65  Resp: (!) 21 20  Temp:  36.6 C  SpO2: 96% 100%    Last Pain:  Vitals:   08/05/24 1150  TempSrc: Oral  PainSc: 0-No pain                 Garnette FORBES Skillern      "

## 2024-08-05 NOTE — Progress Notes (Signed)
 LR IV bolus continues

## 2024-08-05 NOTE — Progress Notes (Signed)
 Dr Laurence and Dr Corinne updated on labs.  Torodol OK to administer.

## 2024-08-05 NOTE — Progress Notes (Signed)
 H&P Update  No updates to above H&P. Patient arrived NPO and was consented in PACU. Risks again discussed, all questions answered, and consent signed. Proceed with scheduled surgery.   Slater Door, MD

## 2024-08-05 NOTE — Transfer of Care (Signed)
 Immediate Anesthesia Transfer of Care Note  Patient: Barbara Vazquez  Procedure(s) Performed: CESAREAN DELIVERY (Abdomen)  Patient Location: PACU  Anesthesia Type:Spinal  Level of Consciousness: awake, alert , and oriented  Airway & Oxygen Therapy: Patient Spontanous Breathing  Post-op Assessment: Report given to RN and Post -op Vital signs reviewed and stable  Post vital signs: Reviewed and stable  Last Vitals:  Vitals Value Taken Time  BP 99/47 08/05/24 10:15  Temp    Pulse 65 08/05/24 10:20  Resp 19 08/05/24 10:20  SpO2 96 % 08/05/24 10:20  Vitals shown include unfiled device data.  Last Pain:  Vitals:   08/05/24 0829  TempSrc: Oral         Complications: No notable events documented.

## 2024-08-05 NOTE — Op Note (Addendum)
 PROCEDURE DATE: 08/05/2024   PREOPERATIVE DIAGNOSIS: [redacted] weeks gestation of pregnancy, AC lag of twin B, malpresentation   POSTOPERATIVE DIAGNOSIS: The same   PROCEDURE:  Primary Low Transverse Cesarean Section   SURGEON:  Dr. Slater Door  ASSISTANT: Alan Molt, CNM   INDICATIONS: This is a 24 yo G2P1001 at [redacted]w[redacted]d wga requiring cesarean section secondary to twin gestation and breech/breech presentation.   Decision made to proceed with LTCS. The risks of cesarean section discussed with the patient included but were not limited to: bleeding which may require transfusion or reoperation; infection which may require antibiotics; injury to bowel, bladder, ureters or other surrounding organs; injury to the fetus; need for additional procedures including hysterectomy in the event of a life-threatening hemorrhage; placental abnormalities wth subsequent pregnancies, incisional problems, thromboembolic phenomenon and other postoperative/anesthesia complications. The patient agreed with the proposed plan, giving informed consent for the procedure.     FINDINGS:  Viable female infant A in breech presentation, Viable female infant B in breech presentation (although delivered vertex); APGARs per nursing record,  Weights pending, Amniotic fluid clear,  Intact placenta, three vessel cord.  Grossly normal uterus. .   ANESTHESIA:  Spinal ESTIMATED BLOOD LOSS: 2099ccs SPECIMENS: Placentas for routine COMPLICATIONS: None immediate   PROCEDURE IN DETAIL:  The patient received intravenous antibiotics (clindamycin  and gentamicin ) and had sequential compression devices applied to her lower extremities while in the preoperative area.  She was then taken to the operating room where spinal anesthesia obtained. She was then placed in a dorsal supine position with a leftward tilt, and prepped and draped in a sterile manner.  A foley catheter was placed into her bladder and attached to constant gravity.  After an adequate timeout  was performed, a Pfannenstiel skin incision was made with scalpel and carried through to the underlying layer of fascia. The fascia was incised in the midline and this incision was extended bilaterally bluntly. Kocher clamps were applied to the superior aspect of the fascial incision and the underlying rectus muscles were dissected off bluntly and with the Bovie. A similar process was carried out on the inferior aspect of the facial incision. The rectus muscles were separated in the midline bluntly and the peritoneum was entered bluntly.  The peritoneum was extended bilaterally and an Alexis retractor was placed for better visualization. A transverse hysterotomy was made with a scalpel and extended bilaterally bluntly. Infant A was successfully delivered using standard breech manevers, and cord was clamped and cut and infant was handed over to awaiting neonatology team. Infant B was internally rotated to vertex and delivered. Cord blood x2 was collected. An extension into the left broad ligament was noted with significant bleeding - a ring forcep was placed on the bleeding vessel for hemostatic purposes. The placentas were delivered intact. The uterus was cleared of clot and debris.  The hysterotomy was closed with 0 vicryl with reinforcement on the L corner with good hemostasis noted.  The peritoneal cavity was inspected and excellent hemostasis was noted. The Alexis retractor was removed. The fascia was closed with 0-Vicryl in a running fashion with good restoration of anatomy.  At this point notified re blood loss >1500ccs - 1g of TXA was given. No additional bleeding was noted. The subcutaneus tissue was irrigated and was reapproximated using 2-0 monocryl.  The skin was closed with 4-0 Vicryl in a subcuticular fashion.  All surgical sites examined and hemostatic at end of procedure.   Pt tolerated the procedure well. All sponge/lap/needle counts  were correct  X 2. Pt taken to recovery room in stable  condition.  Received neo and albumin  intraoperatively. Plan for repeat CBC and coags in PACU. Low threshold for transfusion given EBL - discussed with patient and she understands and is in agreement. Normal and stable vitals throughout the case.   Slater Door, MD

## 2024-08-05 NOTE — Progress Notes (Signed)
 Brief Update Note  Feeling well, breastfeeding. Fundus firm, minimal bleeding. We discussed immediate postop labs - hgb 9.2, plts 210, coags normal.  Reviewed no need for transfusion at this point as she is asymptomatic and her vitals are normal. We discussed that it takes time for equilibration, will reassess labs in the AM.  Slater Door, MD

## 2024-08-05 NOTE — Progress Notes (Signed)
 Progress Note  Patient in PACU - feeling overall well, fatigued but appropriate. Vitals reassuring. Working on latching baby boy. Lab in to draw stat CBC and coags. Will review and consider need for transfusion. In the meantime will covert type and screen to crossmatch 2u pRBCs.  Slater Door, MD

## 2024-08-06 LAB — CBC
HCT: 25.2 % — ABNORMAL LOW (ref 36.0–46.0)
Hemoglobin: 8.3 g/dL — ABNORMAL LOW (ref 12.0–15.0)
MCH: 29.6 pg (ref 26.0–34.0)
MCHC: 32.9 g/dL (ref 30.0–36.0)
MCV: 90 fL (ref 80.0–100.0)
Platelets: 221 10*3/uL (ref 150–400)
RBC: 2.8 MIL/uL — ABNORMAL LOW (ref 3.87–5.11)
RDW: 13.8 % (ref 11.5–15.5)
WBC: 11.6 10*3/uL — ABNORMAL HIGH (ref 4.0–10.5)
nRBC: 0 % (ref 0.0–0.2)

## 2024-08-06 NOTE — Progress Notes (Signed)
 Subjective: Postpartum Day 1: Cesarean Delivery Patient reports tolerating PO and + flatus.    Objective: Vital signs in last 24 hours: Temp:  [97.4 F (36.3 C)-98.6 F (37 C)] 98.3 F (36.8 C) (02/05 0230) Pulse Rate:  [59-87] 65 (02/05 0230) Resp:  [15-21] 18 (02/05 0230) BP: (95-115)/(37-64) 96/60 (02/05 0230) SpO2:  [94 %-100 %] 98 % (02/05 0230)  Physical Exam:  General: alert, cooperative, and no distress Lochia: appropriate Uterine Fundus: firm Incision: healing well DVT Evaluation: No evidence of DVT seen on physical exam.  Recent Labs    08/05/24 1025 08/06/24 0428  HGB 9.2* 8.3*  HCT 27.4* 25.2*    Assessment/Plan: Status post Cesarean section. Postoperative course complicated by delayed bladder emptying (had 300cc when cath earlier)  Will monitor and if unable to void, replace catheter Desires circ of boy twin.  R&B discussed .  Alm JAYSON Cook, MD 08/06/2024, 9:43 AM

## 2024-08-06 NOTE — Plan of Care (Signed)
" °  Problem: Education: Goal: Knowledge of General Education information will improve Description: Including pain rating scale, medication(s)/side effects and non-pharmacologic comfort measures Outcome: Progressing   Problem: Health Behavior/Discharge Planning: Goal: Ability to manage health-related needs will improve Outcome: Progressing   Problem: Clinical Measurements: Goal: Ability to maintain clinical measurements within normal limits will improve Outcome: Progressing Goal: Will remain free from infection Outcome: Progressing Goal: Diagnostic test results will improve Outcome: Progressing Goal: Respiratory complications will improve Outcome: Progressing Goal: Cardiovascular complication will be avoided Outcome: Progressing   Problem: Activity: Goal: Risk for activity intolerance will decrease Outcome: Progressing   Problem: Nutrition: Goal: Adequate nutrition will be maintained Outcome: Progressing   Problem: Coping: Goal: Level of anxiety will decrease Outcome: Progressing   Problem: Elimination: Goal: Will not experience complications related to bowel motility Outcome: Progressing Goal: Will not experience complications related to urinary retention Outcome: Progressing   Problem: Pain Managment: Goal: General experience of comfort will improve and/or be controlled Outcome: Progressing   Problem: Safety: Goal: Ability to remain free from injury will improve Outcome: Progressing   Problem: Skin Integrity: Goal: Risk for impaired skin integrity will decrease Outcome: Progressing   Problem: Education: Goal: Knowledge of the prescribed therapeutic regimen will improve Outcome: Progressing   Problem: Bowel/Gastric: Goal: Gastrointestinal status for postoperative course will improve Outcome: Progressing   Problem: Cardiac: Goal: Ability to maintain an adequate cardiac output Outcome: Progressing Goal: Will show no evidence of cardiac arrhythmias Outcome:  Progressing   Problem: Nutritional: Goal: Will attain and maintain optimal nutritional status Outcome: Progressing   Problem: Neurological: Goal: Will regain or maintain usual level of consciousness Outcome: Progressing   Problem: Clinical Measurements: Goal: Ability to maintain clinical measurements within normal limits Outcome: Progressing Goal: Postoperative complications will be avoided or minimized Outcome: Progressing   Problem: Respiratory: Goal: Will regain and/or maintain adequate ventilation Outcome: Progressing Goal: Respiratory status will improve Outcome: Progressing   Problem: Skin Integrity: Goal: Demonstrates signs of wound healing without infection Outcome: Progressing   Problem: Urinary Elimination: Goal: Will remain free from infection Outcome: Progressing Goal: Ability to achieve and maintain adequate urine output Outcome: Progressing   Problem: Education: Goal: Knowledge of the prescribed therapeutic regimen will improve Outcome: Progressing Goal: Understanding of sexual limitations or changes related to disease process or condition will improve Outcome: Progressing Goal: Individualized Educational Video(s) Outcome: Progressing   Problem: Self-Concept: Goal: Communication of feelings regarding changes in body function or appearance will improve Outcome: Progressing   Problem: Skin Integrity: Goal: Demonstration of wound healing without infection will improve Outcome: Progressing   Problem: Education: Goal: Knowledge of condition will improve Outcome: Progressing Goal: Individualized Educational Video(s) Outcome: Progressing Goal: Individualized Newborn Educational Video(s) Outcome: Progressing   Problem: Activity: Goal: Will verbalize the importance of balancing activity with adequate rest periods Outcome: Progressing Goal: Ability to tolerate increased activity will improve Outcome: Progressing   Problem: Coping: Goal: Ability to  identify and utilize available resources and services will improve Outcome: Progressing   Problem: Life Cycle: Goal: Chance of risk for complications during the postpartum period will decrease Outcome: Progressing   Problem: Role Relationship: Goal: Ability to demonstrate positive interaction with newborn will improve Outcome: Progressing   Problem: Skin Integrity: Goal: Demonstration of wound healing without infection will improve Outcome: Progressing   "

## 2024-08-06 NOTE — Progress Notes (Signed)
 Pt noticed throat feeling a bit tight and right 4th and 5th digits swollen around 1530 today. Benadryl  was given (by day shift nurse). Patient states she felt better and went to sleep. Pt woke up again around 2000 and stated her throat still felt tight. Upon palpation, lymph nodes felt swollen. The pads of her two digits were red but I did not notice a difference in size. Patient not having any sickness symptoms and vitals are within range. Informed patient to call out if symptoms got worse.

## 2024-08-06 NOTE — Lactation Note (Signed)
 This note was copied from a baby's chart. Lactation Consultation Note  Patient Name: Madhuri Vacca Unijb'd Date: 08/06/2024 Age:25 hours   P3. Attempted to see if it was time to feed babies. Mom sleeping.   Maternal Data    Feeding    LATCH Score                    Lactation Tools Discussed/Used    Interventions    Discharge    Consult Status      Ibeth Fahmy G 08/06/2024, 5:34 AM

## 2024-08-06 NOTE — Lactation Note (Signed)
 This note was copied from a baby's chart. 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 LC did not observe latch, multiples had finished breastfeed prior to [facility] entering the room. [name] does not have any questions or concerns for LC at this time. [name] open to using the DEBP now, LC reviewed how to use pump and [name] fitted with 21 mm breast flange. [name] was pumping as LC left the room. [name] will continue to latch multiples first  for every feeding, every 3 hours  or sooner and afterwards will pump and give infant back any EBM. [name] has handout supplementing with breastfeeding. [name] knows to call [facility] services if she has any breastfeeding questions, concerns or need latch assistance.   Maternal Data    Feeding Mother's Current Feeding Choice: Breast Milk  LATCH Score                    Lactation Tools Discussed/Used Tools: Pump;Flanges Flange Size: 21 Breast pump type: Double-Electric Breast Pump Pump Education: Setup, frequency, and cleaning;Milk Storage Reason for Pumping: Multiples, ETI, C/S and hx of hypothyroidism Pumping frequency: [name] will contiue to pump every 3 hours for 15 minutes.  Interventions Interventions: Position options;Expressed milk;DEBP;Education  Discharge    Consult Status Consult Status: Follow-up Date: 08/07/24 Follow-up type: In-patient    [name] 08/06/2024, 3:51 PM

## 2024-08-06 NOTE — Lactation Note (Signed)
 This note was copied from a baby's chart. Lactation Consultation Note  Patient Name: Barbara Vazquez Unijb'd Date: 08/06/2024 Age:25 hours Reason for consult: Initial assessment;Early term 37-38.6wks;Multiple gestation  P3. Mom was holding twins STS on her chest. Mom stated she tried to latch babies. The boy would latch but the girl wouldn't. Mom only prefers to do BM. Mom stated she has a friend who has BM that she can have if mom's milk doesn't come in soon. Asked mom again to call for [facility] to assist w/latching. Maternal Data    Feeding Mother's Current Feeding Choice: Breast Milk  LATCH Score       Type of Nipple: Everted at rest and after stimulation  Comfort (Breast/Nipple): Soft / non-tender         Lactation Tools Discussed/Used Tools: Pump Breast pump type: Double-Electric Breast Pump Pump Education: Setup, frequency, and cleaning Reason for Pumping: twins  Interventions Interventions: DEBP;LC Services brochure  Discharge Discharge Education: Outpatient recommendation Pump: DEBP;Hands Free (Medela DEBP/hands free pump doesn't know name)  Consult Status Consult Status: Follow-up Date: 08/06/24 Follow-up type: In-patient    [name] G 08/06/2024, 2:35 AM

## 2024-08-06 NOTE — Lactation Note (Signed)
 This note was copied from a baby's chart. Lactation Consultation Note  Patient Name: Barbara Vazquez Unijb'd Date: 08/06/2024 Age:25 hours Reason for consult: Initial assessment;Early term 37-38.6wks;Multiple gestation  P3. Mom just dressed twins for bed. Mom stated she had just finished BF. Asked mom if LC could set up DEBP mom stated yes but she wanted to wait until in am to start pumping. LC mentioned to mom to BF one baby at a time to learn each baby and get feeding down w/each baby. Mom stated OK. When LC came back w/pump mom was face timing babies w/her mom. Asked mom to call for [facility] for next feeding.  Maternal Data    Feeding Mother's Current Feeding Choice: Breast Milk  LATCH Score       Type of Nipple: Everted at rest and after stimulation  Comfort (Breast/Nipple): Soft / non-tender         Lactation Tools Discussed/Used Tools: Pump Breast pump type: Double-Electric Breast Pump Pump Education: Setup, frequency, and cleaning Reason for Pumping: twins  Interventions Interventions: DEBP;LC Services brochure  Discharge Discharge Education: Outpatient recommendation Pump: DEBP;Hands Free (Medela DEBP/hands free pump doesn't know name)  Consult Status Consult Status: Follow-up Date: 08/06/24 Follow-up type: In-patient    [name] 08/06/2024, 12:16 AM

## 2024-08-07 LAB — TYPE AND SCREEN
ABO/RH(D): A POS
Antibody Screen: NEGATIVE
Unit division: 0
Unit division: 0

## 2024-08-07 LAB — BPAM RBC
Blood Product Expiration Date: 202602262359
Blood Product Expiration Date: 202602272359
ISSUE DATE / TIME: 202602041041
ISSUE DATE / TIME: 202602041041
Unit Type and Rh: 6200
Unit Type and Rh: 6200

## 2024-08-07 MED ORDER — DOCUSATE SODIUM 100 MG PO CAPS
100.0000 mg | ORAL_CAPSULE | Freq: Every day | ORAL | Status: AC
  Administered 2024-08-07: 100 mg via ORAL
  Filled 2024-08-07: qty 1

## 2024-08-07 MED ORDER — FERROUS SULFATE 325 (65 FE) MG PO TABS
325.0000 mg | ORAL_TABLET | ORAL | Status: AC
  Administered 2024-08-07: 325 mg via ORAL
  Filled 2024-08-07: qty 1

## 2024-08-07 NOTE — Progress Notes (Signed)
 Postpartum Progress Note  Postpartum Day 2 s/p primary Cesarean section.  Subjective:  Patient reports no overnight events.  She reports well controlled pain, ambulating without difficulty, voiding spontaneously, tolerating PO.  She reports Positive flatus, Negative BM.  Vaginal bleeding is minimal.  Objective: Blood pressure 117/81, pulse 76, temperature 97.8 F (36.6 C), temperature source Oral, resp. rate 16, height 5' 5 (1.651 m), weight 120.6 kg, SpO2 99%, unknown if currently breastfeeding.  Physical Exam:  General: alert and no distress Lochia: appropriate Abdomen: soft, ATTP Uterine Fundus: firm Incision: clean/dry/intact DVT Evaluation: No evidence of DVT seen on physical exam.  Recent Labs    08/05/24 1025 08/06/24 0428  HGB 9.2* 8.3*  HCT 27.4* 25.2*    Assessment/Plan: Postpartum Day 2, s/p C-section Acute blood loss anemia, clinically significant - Fe/Colace. No signs or symptoms of anemia.  Twin boy s/p circumcision Lactation following Doing well, continue routine postpartum care. Anticipate discharge home tomorrow   LOS: 2 days   Barbara Vazquez 08/07/2024, 7:50 AM

## 2024-08-07 NOTE — Progress Notes (Signed)
 CSW received consult for hx of Anxiety and CSW met with [name] to offer support and complete assessment. CSW entered the room, introduced herself and acknowledged her spouse was present. CSW gave [name] verbal permission to speak about anything while her spouse was present. CSW explained her role and the reason for the visit. [name] was polite, easy to engage, receptive to meeting with CSW, and appeared forthcoming.  CSW asked [name] about SDOH determined difficulties with food, financial and stress.  [name] reported they have recently went down to 1 income and they are prepping to apply for foodstamps and WIC for support. CSW offered [name] resources for support; [name] declined and reported they are able to afford food at this time. [name] reported her spouse works full time and they have no concerns with financial. Stress was discussed in the passage below.  CSW congratulated the couple on the arrival of the twins. CSW asked [name] about her mental health history. [name] reported being diagnosed with anxiety/depression in the 8th/9th grade. [name] reported currently participating in therapy with  Amy at [facility] every other week for support. [name] denied being prescribed medication for support. 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 [name] reported she did not reach out of support during that time. [name] reported her supports as her spouse and  their family. CSW provided education regarding the baby blues period v s. perinatal mood disorders, discussed treatment and gave resources for mental health follow up if concerns arise.  CSW recommends self-evaluation during the postpartum time period using the New Mom Checklist from Postpartum Progress and encouraged [name] to contact a medical professional if symptoms are noted at any time. CSW assessed for safety with [name] SI/HI/DV;[name] denied all.  CSW asked [name] has she selected a pediatrician for the  infant's follow up visits; [name] said [facility]. [name] reported having all essential items for the infant including a carseat, bassinet and crib for safe sleeping.CSW provided review of Sudden Infant Death Syndrome (SIDS) precautions.  CSW identifies no further need for intervention and no barriers to discharge at this time.  Rosina Molt, [place] Clinical Social Worker [phone]
